# Patient Record
Sex: Female | Born: 1965 | Race: White | Hispanic: No | Marital: Married | Smoking: Former smoker
Health system: Southern US, Community
[De-identification: ages and names within clinical notes are randomized; demographics above are authoritative.]

## PROBLEM LIST (undated history)

## (undated) DIAGNOSIS — H8110 Benign paroxysmal vertigo, unspecified ear: Secondary | ICD-10-CM

## (undated) DIAGNOSIS — J45909 Unspecified asthma, uncomplicated: Secondary | ICD-10-CM

## (undated) DIAGNOSIS — I10 Essential (primary) hypertension: Secondary | ICD-10-CM

## (undated) DIAGNOSIS — R200 Anesthesia of skin: Secondary | ICD-10-CM

## (undated) DIAGNOSIS — R5383 Other fatigue: Secondary | ICD-10-CM

## (undated) DIAGNOSIS — R51 Headache: Secondary | ICD-10-CM

## (undated) DIAGNOSIS — F419 Anxiety disorder, unspecified: Secondary | ICD-10-CM

## (undated) DIAGNOSIS — J189 Pneumonia, unspecified organism: Secondary | ICD-10-CM

## (undated) DIAGNOSIS — B3731 Acute candidiasis of vulva and vagina: Secondary | ICD-10-CM

## (undated) DIAGNOSIS — B373 Candidiasis of vulva and vagina: Secondary | ICD-10-CM

## (undated) DIAGNOSIS — R42 Dizziness and giddiness: Secondary | ICD-10-CM

## (undated) DIAGNOSIS — H6500 Acute serous otitis media, unspecified ear: Secondary | ICD-10-CM

## (undated) DIAGNOSIS — G4730 Sleep apnea, unspecified: Secondary | ICD-10-CM

## (undated) DIAGNOSIS — J069 Acute upper respiratory infection, unspecified: Secondary | ICD-10-CM

## (undated) HISTORY — DX: Other fatigue: R53.83

## (undated) HISTORY — DX: Benign paroxysmal vertigo, unspecified ear: H81.10

## (undated) HISTORY — DX: Acute serous otitis media, unspecified ear: H65.00

## (undated) HISTORY — DX: Anesthesia of skin: R20.0

## (undated) HISTORY — DX: Acute upper respiratory infection, unspecified: J06.9

## (undated) HISTORY — DX: Sleep apnea, unspecified: G47.30

## (undated) HISTORY — DX: Candidiasis of vulva and vagina: B37.3

## (undated) HISTORY — DX: Dizziness and giddiness: R42

## (undated) HISTORY — DX: Anxiety disorder, unspecified: F41.9

## (undated) HISTORY — DX: Acute candidiasis of vulva and vagina: B37.31

## (undated) HISTORY — DX: Headache: R51

## (undated) HISTORY — DX: Essential (primary) hypertension: I10

## (undated) HISTORY — DX: Pneumonia, unspecified organism: J18.9

## (undated) HISTORY — DX: Unspecified asthma, uncomplicated: J45.909

## (undated) HISTORY — PX: TUBAL LIGATION: SHX77

---

## 1998-05-25 ENCOUNTER — Encounter: Payer: Self-pay | Admitting: Emergency Medicine

## 1998-05-25 ENCOUNTER — Emergency Department (HOSPITAL_COMMUNITY): Admission: EM | Admit: 1998-05-25 | Discharge: 1998-05-25 | Payer: Self-pay | Admitting: Emergency Medicine

## 1999-04-02 ENCOUNTER — Other Ambulatory Visit: Admission: RE | Admit: 1999-04-02 | Discharge: 1999-04-02 | Payer: Self-pay | Admitting: Gynecology

## 2005-03-20 ENCOUNTER — Other Ambulatory Visit: Admission: RE | Admit: 2005-03-20 | Discharge: 2005-03-20 | Payer: Self-pay | Admitting: Obstetrics and Gynecology

## 2008-01-07 ENCOUNTER — Encounter (INDEPENDENT_AMBULATORY_CARE_PROVIDER_SITE_OTHER): Payer: Self-pay | Admitting: Obstetrics and Gynecology

## 2008-01-07 ENCOUNTER — Ambulatory Visit (HOSPITAL_COMMUNITY): Admission: RE | Admit: 2008-01-07 | Discharge: 2008-01-07 | Payer: Self-pay | Admitting: Obstetrics and Gynecology

## 2010-10-08 NOTE — Op Note (Signed)
Julie Huffman, Julie Huffman               ACCOUNT NO.:  [id number]   MEDICAL RECORD NO.:  [id number]          PATIENT TYPE:  AMB   LOCATION:  SDC                           FACILITY:  [facility]   PHYSICIAN:  Michelle L. Grewal, M.D.DATE OF BIRTH:  [birth year]   DATE OF PROCEDURE:  01/07/2008  DATE OF DISCHARGE:  01/07/2008                               OPERATIVE REPORT   PREOPERATIVE DIAGNOSES:  1. Desires permanent sterilization.  2. Menorrhagia.   POSTOPERATIVE DIAGNOSES:  1. Desires permanent sterilization.  2. Menorrhagia.  3. Fibroids.   PROCEDURES:  1. Diagnostic laparoscopy with bilateral tubal ligation.  2. Dilation and curettage.  3. Hysteroscopy.  4. ThermaChoice endometrial ablation.   SURGEON:  Michelle L. Vincente Poli, MD.   ANESTHESIA:  General.   ESTIMATED BLOOD LOSS:  Minimal.   PROCEDURE:  The patient was taken to the operating room after informed  consent was obtained.  She was then prepped and draped in the usual  sterile fashion.  An in-and-out catheter used was used to empty the  bladder.  A small infraumbilical incision was made.  The Veress needle  was inserted and pneumoperitoneum was performed.  The 11-mm trocar was  then inserted.  The laparoscope was introduced through the trocar  sheath.  The patient was placed in Trendelenburg position.  Exam of the  pelvis revealed that she had some fibroids in her uterus.  Ovaries  appeared normal.  Using the Kleppinger, I identified the right fallopian  tube, followed it to its fimbriated end, grasped the midportion, and  performed a tubal ligation 3 times with triple-burn technique.  This was  done on the left side in identical fashion.  At the end of this, no  bleeding was noted whatsoever.  The laparoscope was removed, the  pneumoperitoneum was released, the trocar was removed, and the skin was  reapproximated using a 3-0 Vicryl and Dermabond skin adhesive.  At this  point, we went down vaginally.  A speculum was placed in  the vagina.  The cervix was grasped with a tenaculum.  Local was placed at 5 and 7  o'clock.  The cervical internal os was gently dilated using Pratt  dilators.  The diagnostic hysteroscope was inserted into the uterine  cavity with excellent visualization.  The cavity appeared normal.  The  hysteroscope was removed.  A sharp curette was inserted and a thorough  uterine curettage was performed.  The tissue sent to pathology.  The  ThermaChoice III balloon was  inserted and a ThermaChoice endometrial ablation was performed according  to the manufacturer's specification.  The intact balloon was removed  after the 8-minute cycle.  All instruments were removed from the vagina.  All sponge, lap and instrument counts were correct x2.  The patient went  to recovery room in stable condition.      Michelle L. Vincente Poli, M.D.  Electronically Signed     MLG/MEDQ  D:  01/10/2008  T:  01/10/2008  Job:  [id number]

## 2011-02-21 LAB — CBC
HCT: 44.5
Hemoglobin: 15
MCHC: 33.6
MCV: 89.1
Platelets: 383
RBC: 5
RDW: 12.5
WBC: 10.5

## 2011-02-21 LAB — BASIC METABOLIC PANEL
BUN: 7
CO2: 27
Calcium: 9.1
Chloride: 104
Creatinine, Ser: 0.79
GFR calc Af Amer: >60
GFR calc non Af Amer: >60
Glucose, Bld: 120 — ABNORMAL HIGH
Potassium: 4.3
Sodium: 138

## 2011-02-21 LAB — HCG, SERUM, QUALITATIVE: Preg, Serum: NEGATIVE

## 2012-02-04 ENCOUNTER — Encounter: Payer: Self-pay | Admitting: Cardiology

## 2012-02-05 ENCOUNTER — Institutional Professional Consult (permissible substitution): Payer: Self-pay | Admitting: Pulmonary Disease

## 2012-02-05 ENCOUNTER — Institutional Professional Consult (permissible substitution): Payer: Self-pay | Admitting: Cardiology

## 2012-02-27 ENCOUNTER — Ambulatory Visit (INDEPENDENT_AMBULATORY_CARE_PROVIDER_SITE_OTHER): Payer: BC Managed Care – PPO | Admitting: Cardiology

## 2012-02-27 ENCOUNTER — Encounter: Payer: Self-pay | Admitting: *Deleted

## 2012-02-27 ENCOUNTER — Encounter: Payer: Self-pay | Admitting: Cardiology

## 2012-02-27 VITALS — BP 128/80 | HR 78 | Ht 61.0 in | Wt 202.1 lb

## 2012-02-27 DIAGNOSIS — Z8249 Family history of ischemic heart disease and other diseases of the circulatory system: Secondary | ICD-10-CM | POA: Insufficient documentation

## 2012-02-27 DIAGNOSIS — F17200 Nicotine dependence, unspecified, uncomplicated: Secondary | ICD-10-CM

## 2012-02-27 DIAGNOSIS — R0789 Other chest pain: Secondary | ICD-10-CM | POA: Insufficient documentation

## 2012-02-27 DIAGNOSIS — E669 Obesity, unspecified: Secondary | ICD-10-CM | POA: Insufficient documentation

## 2012-02-27 DIAGNOSIS — Z720 Tobacco use: Secondary | ICD-10-CM | POA: Insufficient documentation

## 2012-02-27 LAB — LIPID PANEL
Cholesterol: 194 mg/dL (ref 0–200)
HDL: 40.3 mg/dL (ref >39.00)
LDL Cholesterol: 125 mg/dL — ABNORMAL HIGH (ref 0–99)
Total CHOL/HDL Ratio: 5
Triglycerides: 143 mg/dL (ref 0.0–149.0)
VLDL: 28.6 mg/dL (ref 0.0–40.0)

## 2012-02-27 NOTE — Patient Instructions (Signed)
We will schedule you for a stress Echo   I recommend you have a fasting lipid panel done  Follow up with pulmonary as noted.

## 2012-02-27 NOTE — Progress Notes (Signed)
Nadara Mode Date of Birth: October 30, 1965 Medical Record #[id number]  History of Present Illness: Mrs. Julie Huffman is seen at the request of [facility] for evaluation of chest tightness. She is a pleasant 46 year old white female who has a history of tobacco abuse, obesity, and family history of coronary disease. Her cholesterol status is unknown. She has been on medication previously for hypertension but no longer requires. She has no history of diabetes. Patient presents with a number of complaints. She does complain of 6 month history of intermittent chest tightness. This may occur at any time. She does have a history of asthma. She has also tried Zantac for relief and think she got a little bit of relief. She admits that she has been under a lot of stress. She is very worried about having blockage in her arteries.  Current Outpatient Prescriptions on File Prior to Visit  Medication Sig Dispense Refill  . escitalopram (LEXAPRO) 10 MG tablet Take 10 mg by mouth daily.       . Fluticasone-Salmeterol (ADVAIR) 250-50 MCG/DOSE AEPB Inhale 1 puff into the lungs every 12 (twelve) hours.      Marland Kitchen albuterol (PROVENTIL HFA;VENTOLIN HFA) 108 (90 BASE) MCG/ACT inhaler Inhale 2 puffs into the lungs every 6 (six) hours as needed.      Marland Kitchen MIMVEY 1-0.5 MG per tablet         No Known Allergies  Past Medical History  Diagnosis Date  . Dizziness   . Headache   . Fatigue   . Sleep apnea   . Left arm numbness   . Benign paroxysmal positional vertigo   . Acute serous otitis media   . Acute upper respiratory infections of unspecified site   . Anxiety disorder   . Asthma   . Hypertension   . Pneumonia, organism unspecified   . Candidiasis of vulva and vagina     Past Surgical History  Procedure Date  . Tubal ligation     History  Smoking status  . Current Every Day Smoker  . Types: Cigarettes  Smokeless tobacco  . Never Used    History  Alcohol Use: Not on file    History reviewed.  No pertinent family history.  Review of Systems: The review of systems is positive for recent urine infection. This is associated with significant headache and vertigo. She was treated with antibiotics with improvement. She also complains of not sleeping well. She feels tired during the day. She wakes up feeling tired. She does have a history of snoring and restless legs. He does have a history of tobacco use. She has tried with Chantix in the past to quit. She is concerned about weight gain if she tries to quit. Her plan is to lose down to 130 pounds and then to try quitting. All other systems were reviewed and are negative.  Physical Exam: BP 128/80  Pulse 78  Ht 5\' 1"  (1.549 m)  Wt 202 lb 1.9 oz (91.681 kg)  BMI 38.19 kg/m2  SpO2 94% She is a pleasant, obese white female in no acute distress. Her HEENT exam is unremarkable. PERRLA, EOMI, sclera are clear. Oropharynx is clear. Neck is supple without JVD, adenopathy, thyromegaly, or bruits. Lungs are clear. Cardiac exam reveals a regular rate and rhythm without gallop, murmur, or click. Breasts are pendulous. Abdomen is soft and nontender. She has no masses or bruits. It is obese. Femoral and pedal pulses are 2+. She has no edema. Skin is warm and dry. She  is alert and oriented x3. Cranial nerves II through XII are intact.  LABORATORY DATA: Recent ECG shows normal sinus rhythm with a normal ECG. Chemistry panel and TSH were also normal recently.  Assessment / Plan: 1. Chest tightness. Symptoms are atypical for cardiac disease but she does have multiple risk factors including tobacco use, family history, and obesity. I recommended a stress echo to further stratify her cardiac risk. She already has an appointment scheduled with pulmonary for evaluation as well. Her symptoms may be related to asthma, GERD, or sleep apnea.  2. Obesity. Patient hasn't multiple symptoms consistent with sleep apnea. Further evaluation per pulmonary.  3. Lipid status  is unknown. I recommended a fasting lipid panel.  4. Tobacco abuse.

## 2012-03-02 ENCOUNTER — Institutional Professional Consult (permissible substitution): Payer: Self-pay | Admitting: Pulmonary Disease

## 2012-03-03 ENCOUNTER — Encounter: Payer: Self-pay | Admitting: Cardiology

## 2012-03-12 ENCOUNTER — Telehealth: Payer: Self-pay | Admitting: Cardiology

## 2012-03-12 NOTE — Telephone Encounter (Signed)
Pt rtn your call

## 2012-03-12 NOTE — Telephone Encounter (Signed)
Patient called no answer.Left message on personal voice mail letter mailed with lab results.Advised to call if any questions.

## 2012-03-12 NOTE — Telephone Encounter (Signed)
Patient called no answer.LMTC. 

## 2012-03-24 ENCOUNTER — Encounter: Payer: Self-pay | Admitting: Cardiology

## 2012-03-24 ENCOUNTER — Ambulatory Visit (HOSPITAL_COMMUNITY): Payer: BC Managed Care – PPO | Attending: Cardiology

## 2012-03-24 ENCOUNTER — Other Ambulatory Visit: Payer: Self-pay

## 2012-03-24 DIAGNOSIS — Z8249 Family history of ischemic heart disease and other diseases of the circulatory system: Secondary | ICD-10-CM

## 2012-03-24 DIAGNOSIS — R0989 Other specified symptoms and signs involving the circulatory and respiratory systems: Secondary | ICD-10-CM

## 2012-03-24 DIAGNOSIS — Z720 Tobacco use: Secondary | ICD-10-CM

## 2012-03-24 DIAGNOSIS — R0789 Other chest pain: Secondary | ICD-10-CM

## 2012-03-24 DIAGNOSIS — F17200 Nicotine dependence, unspecified, uncomplicated: Secondary | ICD-10-CM | POA: Insufficient documentation

## 2012-03-24 DIAGNOSIS — J45909 Unspecified asthma, uncomplicated: Secondary | ICD-10-CM | POA: Insufficient documentation

## 2012-03-24 DIAGNOSIS — E669 Obesity, unspecified: Secondary | ICD-10-CM

## 2012-03-24 DIAGNOSIS — I10 Essential (primary) hypertension: Secondary | ICD-10-CM | POA: Insufficient documentation

## 2012-03-24 DIAGNOSIS — R072 Precordial pain: Secondary | ICD-10-CM | POA: Insufficient documentation

## 2012-03-24 NOTE — Progress Notes (Signed)
Echocardiogram performed.  

## 2012-04-07 ENCOUNTER — Encounter: Payer: Self-pay | Admitting: Pulmonary Disease

## 2012-04-07 ENCOUNTER — Ambulatory Visit (INDEPENDENT_AMBULATORY_CARE_PROVIDER_SITE_OTHER): Payer: BC Managed Care – PPO | Admitting: Pulmonary Disease

## 2012-04-07 VITALS — BP 120/90 | HR 100 | Temp 98.5°F | Ht 62.0 in | Wt 202.8 lb

## 2012-04-07 DIAGNOSIS — G4733 Obstructive sleep apnea (adult) (pediatric): Secondary | ICD-10-CM

## 2012-04-07 HISTORY — DX: Obstructive sleep apnea (adult) (pediatric): G47.33

## 2012-04-07 NOTE — Assessment & Plan Note (Signed)
The patient's history is very suggestive of severe sleep apnea.  She is obese, has abnormal upper airway anatomy, has nonrestorative sleep, and significant sleepiness during the day.  I have had a long discussion with her about sleep apnea, including its impact to her quality of life and cardiovascular health.  I think she needs to have a sleep study for diagnosis, and the patient is agreeable.

## 2012-04-07 NOTE — Patient Instructions (Addendum)
Will schedule for a sleep study, and arrange followup once results are available.   

## 2012-04-07 NOTE — Progress Notes (Signed)
  Subjective:    Patient ID: Julie Huffman, female    DOB: 1966-02-10, 46 y.o.   MRN: [id number]  HPI The patient is a 46 year old female who been asked to see for possible obstructive sleep apnea.  She has a history of loud snoring, as well as an abnormal breathing pattern during sleep.  She has awakenings at night, and has not rested in the mornings upon arising.  She has significant sleepiness during the day with any period of inactivity, and will fall asleep very easily in church.  She is unable to stay awake for television or movies in the evening, and has some sleepiness with driving.  The patient states that her weight is neutral over the last few years, and her Epworth score today is abnormal at 21  Sleep Questionnaire: What time do you typically go to bed?( Between what hours) 10 - 11 pm How long does it take you to fall asleep? varies How many times during the night do you wake up? What time do you get out of bed to start your day? Do you drive or operate heavy machinery in your occupation? No How much has your weight changed (up or down) over the past two years? (In pounds) 20 lb (9.072 kg) Have you ever had a sleep study before? No Do you currently use CPAP? No Do you wear oxygen at any time? No    Review of Systems  Constitutional: Negative for fever and unexpected weight change.  HENT: Positive for congestion, rhinorrhea and sinus pressure. Negative for ear pain, nosebleeds, sore throat, sneezing, trouble swallowing, dental problem and postnasal drip.   Eyes: Negative for redness and itching.  Respiratory: Positive for cough and shortness of breath. Negative for chest tightness and wheezing.   Cardiovascular: Negative for palpitations and leg swelling.  Gastrointestinal: Negative for nausea and vomiting.  Genitourinary: Negative for dysuria.  Musculoskeletal: Negative for joint swelling.  Skin: Negative for rash.  Neurological: Positive for headaches.  Hematological: Does not  bruise/bleed easily.  Psychiatric/Behavioral: Positive for dysphoric mood. The patient is nervous/anxious.        Objective:   Physical Exam Constitutional:  Obese female, no acute distress  HENT:  Nares patent without discharge, but septal deviation to the left.  Oropharynx without exudate, palate and uvula are thick and elongated, +side wall narrowing.  Eyes:  Perrla, eomi, no scleral icterus  Neck:  No JVD, no TMG  Cardiovascular:  Normal rate, regular rhythm, no rubs or gallops.  2/6 sem        Intact distal pulses  Pulmonary :  Normal breath sounds, no stridor or respiratory distress   No rales, rhonchi, a few wheezes noted  Abdominal:  Soft, nondistended, bowel sounds present.  No tenderness noted.   Musculoskeletal: mild lower extremity edema noted.  Lymph Nodes:  No cervical lymphadenopathy noted  Skin:  No cyanosis noted  Neurologic:  Alert, appropriate, moves all 4 extremities without obvious deficit.         Assessment & Plan:

## 2012-04-15 ENCOUNTER — Other Ambulatory Visit: Payer: Self-pay | Admitting: Obstetrics and Gynecology

## 2012-05-05 ENCOUNTER — Encounter (HOSPITAL_BASED_OUTPATIENT_CLINIC_OR_DEPARTMENT_OTHER): Payer: BC Managed Care – PPO

## 2012-05-11 ENCOUNTER — Ambulatory Visit (HOSPITAL_BASED_OUTPATIENT_CLINIC_OR_DEPARTMENT_OTHER): Payer: BC Managed Care – PPO | Attending: Pulmonary Disease | Admitting: Radiology

## 2012-05-11 VITALS — Ht 62.0 in | Wt 200.0 lb

## 2012-05-11 DIAGNOSIS — G4733 Obstructive sleep apnea (adult) (pediatric): Secondary | ICD-10-CM | POA: Insufficient documentation

## 2012-06-08 DIAGNOSIS — G4730 Sleep apnea, unspecified: Secondary | ICD-10-CM

## 2012-06-08 DIAGNOSIS — G4710 Hypersomnia, unspecified: Secondary | ICD-10-CM

## 2012-06-09 ENCOUNTER — Telehealth: Payer: Self-pay | Admitting: Pulmonary Disease

## 2012-06-09 NOTE — Telephone Encounter (Addendum)
LMTCB x 1 at home #.  Needs ASAP ov. Can use a 4:30 held slot, except on Mondays or 1st available. Voicemail has not been set up on mobile #. Work # msg says this # cannot be completed as dialed, check the # and dial again.

## 2012-06-09 NOTE — Procedures (Signed)
NAME:  Julie Huffman, Julie Huffman               ACCOUNT NO.:  [id number]  MEDICAL RECORD NO.:  [id number]          PATIENT TYPE:  OUT  LOCATION:  SLEEP CENTER                 FACILITY:  [facility]  PHYSICIAN:  Barbaraann Share, MD,FCCPDATE OF BIRTH:  10/24/1965  DATE OF STUDY:  05/11/2012                           NOCTURNAL POLYSOMNOGRAM  REFERRING PHYSICIAN:  Barbaraann Share, MD,FCCP  INDICATION FOR STUDY:  Hypersomnia with sleep apnea.  EPWORTH SLEEPINESS SCORE:  19.  MEDICATIONS:  SLEEP ARCHITECTURE:  The patient had a total sleep time of 383 minutes with no slow-wave sleep and only 11 minutes of REM.  Sleep onset latency was normal at 1 minute and REM onset was very prolonged at 326 minutes. Sleep efficiency was excellent at 99%.  RESPIRATORY DATA:  The patient was found to have 176 obstructive apneas and 113 obstructive hypopneas, giving her an apnea-hypopnea index of 45 events per hour.  The events occur primarily in the supine position, and there was loud snoring noted throughout.  OXYGEN DATA:  There was O2 desaturation as low as 61% with the patient's obstructive events.  CARDIAC DATA:  No clinically significant arrhythmias were noted.  MOVEMENT-PARASOMNIA:  The patient has no significant leg jerks or other abnormal behavior seen.  IMPRESSIONS-RECOMMENDATIONS:  Severe obstructive sleep apnea/hypopnea syndrome, with an apnea-hypopnea index of 45 events per hour and oxygen desaturation as low as 61%.  Treatment for this degree of sleep apnea should focus primarily on continuous positive airway pressure as well as a trial of weight loss.     Barbaraann Share, MD,FCCP Diplomate, American Board of Sleep Medicine    KMC/MEDQ  D:  06/08/2012 18:47:06  T:  06/09/2012 01:59:42  Job:  [id number]

## 2012-06-09 NOTE — Telephone Encounter (Signed)
Pt returned call. Per Lawson Fiscal I have scheduled f/u for sleep study results for Tues. 06-15-12 @ 4:30. Hazel Sams

## 2012-06-09 NOTE — Telephone Encounter (Signed)
Noted and sleep study placed in the file draw up front until appt date arrives.

## 2012-06-15 ENCOUNTER — Encounter: Payer: Self-pay | Admitting: Pulmonary Disease

## 2012-06-15 ENCOUNTER — Ambulatory Visit (INDEPENDENT_AMBULATORY_CARE_PROVIDER_SITE_OTHER): Payer: BC Managed Care – PPO | Admitting: Pulmonary Disease

## 2012-06-15 VITALS — BP 120/80 | HR 73 | Temp 97.3°F | Ht 62.0 in | Wt 210.0 lb

## 2012-06-15 DIAGNOSIS — G4733 Obstructive sleep apnea (adult) (pediatric): Secondary | ICD-10-CM

## 2012-06-15 NOTE — Assessment & Plan Note (Signed)
The patient has severe obstructive sleep apnea by her recent sleep study, and I have recommended a trial of CPAP while she is working on weight loss.  The patient is agreeable to this approach. I will set the patient up on cpap at a moderate pressure level to allow for desensitization, and will troubleshoot the device over the next 4-6weeks if needed.  The pt is to call me if having issues with tolerance.  Will then optimize the pressure once patient is able to wear cpap on a consistent basis.

## 2012-06-15 NOTE — Progress Notes (Signed)
  Subjective:    Patient ID: Julie Huffman, female    DOB: 02-06-1966, 47 y.o.   MRN: [id number]  HPI The patient comes in today for followup after her recent sleep study.  She was found to have severe obstructive sleep apnea, with an AHI of 45 events per hour and oxygen desaturation as low as 61%.  I have reviewed this study with her in detail, and answered all of her questions.   Review of Systems  Constitutional: Negative for fever and unexpected weight change.  HENT: Positive for ear pain, congestion, sneezing and sinus pressure. Negative for nosebleeds, sore throat, rhinorrhea, trouble swallowing, dental problem and postnasal drip.   Eyes: Negative for redness and itching.  Respiratory: Positive for cough. Negative for chest tightness, shortness of breath and wheezing.   Cardiovascular: Negative for palpitations and leg swelling.  Gastrointestinal: Negative for nausea and vomiting.  Genitourinary: Negative for dysuria.  Musculoskeletal: Negative for joint swelling.  Skin: Negative for rash.  Neurological: Positive for headaches.  Hematological: Does not bruise/bleed easily.  Psychiatric/Behavioral: Negative for dysphoric mood. The patient is not nervous/anxious.        Objective:   Physical Exam Morbidly obese female in no acute distress Nose without purulence or discharge noted Neck without lymphadenopathy or thyromegaly Lower extremities with edema noted, no cyanosis Alert and oriented, moves all 4 extremities.       Assessment & Plan:

## 2012-06-15 NOTE — Patient Instructions (Addendum)
Will set up on cpap.  Please call if having tolerance issues. Work on weight loss followup with me in 6 weeks.

## 2012-07-28 ENCOUNTER — Ambulatory Visit: Payer: BC Managed Care – PPO | Admitting: Pulmonary Disease

## 2012-10-12 ENCOUNTER — Telehealth: Payer: Self-pay | Admitting: Pulmonary Disease

## 2012-10-12 NOTE — Telephone Encounter (Signed)
LMTCB

## 2012-10-13 NOTE — Telephone Encounter (Signed)
LMOM TCB x2

## 2012-10-14 NOTE — Telephone Encounter (Signed)
LMTCB on cell number and home number. Carron Curie, CMA

## 2012-10-15 NOTE — Telephone Encounter (Signed)
Called spoke with patient who stated that she just wanted to know the name of her DME company because she was having some issues with her CPAP machine and wanted to speak with someone troubleshooting.  Pt stated that she found the name on the internet ([employer]) and is in the process of contacting someone w/ [employer].  Offered to call [employer] for her, but pt declined.  She will call back if unable to reach [employer].  Nothing further needed at this time; will sign off.

## 2013-05-11 ENCOUNTER — Other Ambulatory Visit: Payer: Self-pay | Admitting: Obstetrics and Gynecology

## 2014-05-12 ENCOUNTER — Other Ambulatory Visit: Payer: Self-pay | Admitting: Obstetrics and Gynecology

## 2014-05-15 LAB — CYTOLOGY - PAP

## 2015-05-24 ENCOUNTER — Other Ambulatory Visit: Payer: Self-pay | Admitting: Family Medicine

## 2015-05-24 ENCOUNTER — Ambulatory Visit
Admission: RE | Admit: 2015-05-24 | Discharge: 2015-05-24 | Disposition: A | Discharge status: Home / Self Care | Payer: BC Managed Care – PPO | Source: Ambulatory Visit | Attending: Family Medicine | Admitting: Family Medicine

## 2015-05-24 ENCOUNTER — Other Ambulatory Visit: Payer: Self-pay

## 2015-05-24 DIAGNOSIS — J0101 Acute recurrent maxillary sinusitis: Secondary | ICD-10-CM

## 2016-02-05 ENCOUNTER — Other Ambulatory Visit (HOSPITAL_COMMUNITY): Payer: Self-pay | Admitting: Family Medicine

## 2016-02-05 ENCOUNTER — Ambulatory Visit (HOSPITAL_COMMUNITY)
Admission: RE | Admit: 2016-02-05 | Discharge: 2016-02-05 | Disposition: A | Discharge status: Home / Self Care | Payer: BC Managed Care – PPO | Source: Ambulatory Visit | Attending: Family Medicine | Admitting: Family Medicine

## 2016-02-05 DIAGNOSIS — M79605 Pain in left leg: Secondary | ICD-10-CM | POA: Insufficient documentation

## 2016-02-05 DIAGNOSIS — M7989 Other specified soft tissue disorders: Secondary | ICD-10-CM | POA: Diagnosis not present

## 2016-02-05 NOTE — Progress Notes (Signed)
Preliminary results by tech - Venous Duplex Left Lower Ext. Completed. Negative for deep and superficial vein thrombosis, and Baker's Cyst. Marilynne Halstedita Kamelia Lampkins, BS, RDMS, RVT

## 2020-09-20 ENCOUNTER — Emergency Department (HOSPITAL_COMMUNITY): Payer: BC Managed Care – PPO

## 2020-09-20 ENCOUNTER — Encounter (HOSPITAL_COMMUNITY)
Admission: EM | Disposition: A | Discharge status: Home / Self Care | Payer: Self-pay | Source: Home / Self Care | Attending: Internal Medicine

## 2020-09-20 ENCOUNTER — Encounter (HOSPITAL_COMMUNITY): Payer: Self-pay

## 2020-09-20 ENCOUNTER — Inpatient Hospital Stay (HOSPITAL_COMMUNITY)
Admission: EM | Admit: 2020-09-20 | Discharge: 2020-09-22 | DRG: 906 | Disposition: A | Discharge status: Home / Self Care | Payer: BC Managed Care – PPO | Attending: Internal Medicine | Admitting: Internal Medicine

## 2020-09-20 ENCOUNTER — Other Ambulatory Visit: Payer: Self-pay

## 2020-09-20 DIAGNOSIS — S68123A Partial traumatic metacarpophalangeal amputation of left middle finger, initial encounter: Principal | ICD-10-CM | POA: Diagnosis present

## 2020-09-20 DIAGNOSIS — R0602 Shortness of breath: Secondary | ICD-10-CM

## 2020-09-20 DIAGNOSIS — Z23 Encounter for immunization: Secondary | ICD-10-CM

## 2020-09-20 DIAGNOSIS — S62631B Displaced fracture of distal phalanx of left index finger, initial encounter for open fracture: Secondary | ICD-10-CM | POA: Diagnosis not present

## 2020-09-20 DIAGNOSIS — F411 Generalized anxiety disorder: Secondary | ICD-10-CM | POA: Diagnosis present

## 2020-09-20 DIAGNOSIS — Z79899 Other long term (current) drug therapy: Secondary | ICD-10-CM

## 2020-09-20 DIAGNOSIS — J440 Chronic obstructive pulmonary disease with acute lower respiratory infection: Secondary | ICD-10-CM | POA: Diagnosis present

## 2020-09-20 DIAGNOSIS — F17210 Nicotine dependence, cigarettes, uncomplicated: Secondary | ICD-10-CM | POA: Diagnosis present

## 2020-09-20 DIAGNOSIS — S68125A Partial traumatic metacarpophalangeal amputation of left ring finger, initial encounter: Secondary | ICD-10-CM | POA: Diagnosis present

## 2020-09-20 DIAGNOSIS — J9601 Acute respiratory failure with hypoxia: Secondary | ICD-10-CM | POA: Diagnosis present

## 2020-09-20 DIAGNOSIS — E876 Hypokalemia: Secondary | ICD-10-CM | POA: Diagnosis present

## 2020-09-20 DIAGNOSIS — F419 Anxiety disorder, unspecified: Secondary | ICD-10-CM | POA: Diagnosis present

## 2020-09-20 DIAGNOSIS — Z20822 Contact with and (suspected) exposure to covid-19: Secondary | ICD-10-CM | POA: Diagnosis present

## 2020-09-20 DIAGNOSIS — I10 Essential (primary) hypertension: Secondary | ICD-10-CM | POA: Diagnosis present

## 2020-09-20 DIAGNOSIS — J189 Pneumonia, unspecified organism: Secondary | ICD-10-CM | POA: Diagnosis present

## 2020-09-20 DIAGNOSIS — J9602 Acute respiratory failure with hypercapnia: Secondary | ICD-10-CM | POA: Diagnosis not present

## 2020-09-20 DIAGNOSIS — G9341 Metabolic encephalopathy: Secondary | ICD-10-CM | POA: Diagnosis present

## 2020-09-20 DIAGNOSIS — K219 Gastro-esophageal reflux disease without esophagitis: Secondary | ICD-10-CM | POA: Diagnosis present

## 2020-09-20 DIAGNOSIS — G4733 Obstructive sleep apnea (adult) (pediatric): Secondary | ICD-10-CM | POA: Diagnosis present

## 2020-09-20 DIAGNOSIS — J45901 Unspecified asthma with (acute) exacerbation: Secondary | ICD-10-CM | POA: Diagnosis present

## 2020-09-20 DIAGNOSIS — Z8616 Personal history of COVID-19: Secondary | ICD-10-CM

## 2020-09-20 DIAGNOSIS — Z9851 Tubal ligation status: Secondary | ICD-10-CM

## 2020-09-20 DIAGNOSIS — J159 Unspecified bacterial pneumonia: Secondary | ICD-10-CM | POA: Diagnosis present

## 2020-09-20 DIAGNOSIS — W28XXXA Contact with powered lawn mower, initial encounter: Secondary | ICD-10-CM

## 2020-09-20 HISTORY — PX: I & D EXTREMITY: SHX5045

## 2020-09-20 HISTORY — PX: INCISION AND DRAINAGE OF WOUND: SHX1803

## 2020-09-20 LAB — CBC
HCT: 42.7 % (ref 36.0–46.0)
Hemoglobin: 13.7 g/dL (ref 12.0–15.0)
MCH: 29.8 pg (ref 26.0–34.0)
MCHC: 32.1 g/dL (ref 30.0–36.0)
MCV: 92.8 fL (ref 80.0–100.0)
Platelets: 255 10*3/uL (ref 150–400)
RBC: 4.6 MIL/uL (ref 3.87–5.11)
RDW: 12.7 % (ref 11.5–15.5)
WBC: 8.1 10*3/uL (ref 4.0–10.5)
nRBC: 0 % (ref 0.0–0.2)

## 2020-09-20 LAB — BASIC METABOLIC PANEL
Anion gap: 9 (ref 5–15)
BUN: 11 mg/dL (ref 6–20)
CO2: 30 mmol/L (ref 22–32)
Calcium: 8.9 mg/dL (ref 8.9–10.3)
Chloride: 100 mmol/L (ref 98–111)
Creatinine, Ser: 0.76 mg/dL (ref 0.44–1.00)
GFR, Estimated: >60 mL/min (ref >60)
Glucose, Bld: 122 mg/dL — ABNORMAL HIGH (ref 70–99)
Potassium: 3 mmol/L — ABNORMAL LOW (ref 3.5–5.1)
Sodium: 139 mmol/L (ref 135–145)

## 2020-09-20 LAB — RESP PANEL BY RT-PCR (FLU A&B, COVID) ARPGX2
Influenza A by PCR: NEGATIVE
Influenza B by PCR: NEGATIVE
SARS Coronavirus 2 by RT PCR: NEGATIVE

## 2020-09-20 LAB — POC SARS CORONAVIRUS 2 AG -  ED: SARSCOV2ONAVIRUS 2 AG: NEGATIVE

## 2020-09-20 SURGERY — IRRIGATION AND DEBRIDEMENT EXTREMITY
Anesthesia: General | Site: Finger | Laterality: Left

## 2020-09-20 MED ORDER — HYDROMORPHONE HCL 1 MG/ML IJ SOLN
0.5000 mg | Freq: Once | INTRAMUSCULAR | Status: AC
  Administered 2020-09-20: 0.5 mg via INTRAVENOUS
  Filled 2020-09-20: qty 1

## 2020-09-20 MED ORDER — MIDAZOLAM HCL 2 MG/2ML IJ SOLN
INTRAMUSCULAR | Status: AC
  Filled 2020-09-20: qty 2

## 2020-09-20 MED ORDER — BUPIVACAINE HCL (PF) 0.25 % IJ SOLN
INTRAMUSCULAR | Status: AC
  Filled 2020-09-20: qty 30

## 2020-09-20 MED ORDER — TETANUS-DIPHTH-ACELL PERTUSSIS 5-2.5-18.5 LF-MCG/0.5 IM SUSY
0.5000 mL | PREFILLED_SYRINGE | Freq: Once | INTRAMUSCULAR | Status: AC
Start: 2020-09-20 — End: 2020-09-20
  Administered 2020-09-20: 0.5 mL via INTRAMUSCULAR
  Filled 2020-09-20: qty 0.5

## 2020-09-20 MED ORDER — PROPOFOL 10 MG/ML IV BOLUS
INTRAVENOUS | Status: AC
Start: 2020-09-20 — End: ?
  Filled 2020-09-20: qty 40

## 2020-09-20 MED ORDER — FENTANYL CITRATE (PF) 250 MCG/5ML IJ SOLN
INTRAMUSCULAR | Status: AC
  Filled 2020-09-20: qty 5

## 2020-09-20 SURGICAL SUPPLY — 62 items
ADAPTER CATH SYR TO TUBING 38M (ADAPTER) IMPLANT
BNDG COHESIVE 2X5 TAN STRL LF (GAUZE/BANDAGES/DRESSINGS) IMPLANT
BNDG ELASTIC 3X5.8 VLCR STR LF (GAUZE/BANDAGES/DRESSINGS) ×2 IMPLANT
BNDG ELASTIC 4X5.8 VLCR STR LF (GAUZE/BANDAGES/DRESSINGS) ×2 IMPLANT
BNDG ESMARK 4X9 LF (GAUZE/BANDAGES/DRESSINGS) ×2 IMPLANT
BNDG GAUZE ELAST 4 BULKY (GAUZE/BANDAGES/DRESSINGS) ×2 IMPLANT
CANNULA VESSEL 3MM 2 BLNT TIP (CANNULA) IMPLANT
CORD BIPOLAR FORCEPS 12FT (ELECTRODE) IMPLANT
COVER SURGICAL LIGHT HANDLE (MISCELLANEOUS) ×2 IMPLANT
COVER WAND RF STERILE (DRAPES) ×2 IMPLANT
CUFF TOURN SGL QUICK 18X4 (TOURNIQUET CUFF) IMPLANT
CUFF TOURN SGL QUICK 24 (TOURNIQUET CUFF)
CUFF TRNQT CYL 24X4X16.5-23 (TOURNIQUET CUFF) IMPLANT
DECANTER SPIKE VIAL GLASS SM (MISCELLANEOUS) ×2 IMPLANT
DRAIN PENROSE 1/4X12 LTX STRL (WOUND CARE) IMPLANT
DRSG PAD ABDOMINAL 8X10 ST (GAUZE/BANDAGES/DRESSINGS) ×4 IMPLANT
DRSG XEROFORM 1X8 (GAUZE/BANDAGES/DRESSINGS) ×2 IMPLANT
GAUZE SPONGE 4X4 12PLY STRL (GAUZE/BANDAGES/DRESSINGS) ×2 IMPLANT
GAUZE XEROFORM 1X8 LF (GAUZE/BANDAGES/DRESSINGS) ×2 IMPLANT
GLOVE BIO SURGEON STRL SZ7.5 (GLOVE) ×2 IMPLANT
GLOVE BIOGEL PI IND STRL 8.5 (GLOVE) IMPLANT
GLOVE BIOGEL PI INDICATOR 8.5 (GLOVE)
GLOVE BIOGEL PI ORTHO PRO SZ8 (GLOVE)
GLOVE PI ORTHO PRO STRL SZ8 (GLOVE) IMPLANT
GLOVE SRG 8 PF TXTR STRL LF DI (GLOVE) ×1 IMPLANT
GLOVE SURG ORTHO 8.0 STRL STRW (GLOVE) IMPLANT
GLOVE SURG UNDER POLY LF SZ8 (GLOVE) ×2
GOWN STRL REUS W/ TWL LRG LVL3 (GOWN DISPOSABLE) ×1 IMPLANT
GOWN STRL REUS W/ TWL XL LVL3 (GOWN DISPOSABLE) ×1 IMPLANT
GOWN STRL REUS W/TWL LRG LVL3 (GOWN DISPOSABLE) ×2
GOWN STRL REUS W/TWL XL LVL3 (GOWN DISPOSABLE) ×2
KIT BASIN OR (CUSTOM PROCEDURE TRAY) ×2 IMPLANT
KIT TURNOVER KIT B (KITS) ×2 IMPLANT
LOOP VESSEL MAXI BLUE (MISCELLANEOUS) IMPLANT
MANIFOLD NEPTUNE II (INSTRUMENTS) IMPLANT
NEEDLE HYPO 25X1 1.5 SAFETY (NEEDLE) ×2 IMPLANT
NS IRRIG 1000ML POUR BTL (IV SOLUTION) ×2 IMPLANT
PACK ORTHO EXTREMITY (CUSTOM PROCEDURE TRAY) ×2 IMPLANT
PAD ARMBOARD 7.5X6 YLW CONV (MISCELLANEOUS) ×4 IMPLANT
PAD CAST 4YDX4 CTTN HI CHSV (CAST SUPPLIES) ×1 IMPLANT
PADDING CAST ABS 3INX4YD NS (CAST SUPPLIES) ×1
PADDING CAST ABS COTTON 3X4 (CAST SUPPLIES) ×1 IMPLANT
PADDING CAST COTTON 4X4 STRL (CAST SUPPLIES) ×2
SET CYSTO W/LG BORE CLAMP LF (SET/KITS/TRAYS/PACK) IMPLANT
SLING ARM FOAM STRAP LRG (SOFTGOODS) ×2 IMPLANT
SOL PREP POV-IOD 4OZ 10% (MISCELLANEOUS) ×4 IMPLANT
SPLINT PLASTER EXTRA FAST 3X15 (CAST SUPPLIES) ×1
SPLINT PLASTER GYPS XFAST 3X15 (CAST SUPPLIES) ×1 IMPLANT
SPONGE LAP 4X18 RFD (DISPOSABLE) ×2 IMPLANT
SUT CHROMIC 6 0 PS 4 (SUTURE) ×2 IMPLANT
SUT ETHILON 4 0 P 3 18 (SUTURE) IMPLANT
SUT ETHILON 4 0 PS 2 18 (SUTURE) IMPLANT
SUT MON AB 5-0 P3 18 (SUTURE) ×4 IMPLANT
SWAB COLLECTION DEVICE MRSA (MISCELLANEOUS) IMPLANT
SWAB CULTURE ESWAB REG 1ML (MISCELLANEOUS) IMPLANT
SYR 20ML LL LF (SYRINGE) IMPLANT
SYR CONTROL 10ML LL (SYRINGE) IMPLANT
TOWEL GREEN STERILE (TOWEL DISPOSABLE) ×2 IMPLANT
TUBE CONNECTING 12X1/4 (SUCTIONS) ×2 IMPLANT
TUBE FEEDING ENTERAL 5FR 16IN (TUBING) IMPLANT
UNDERPAD 30X36 HEAVY ABSORB (UNDERPADS AND DIAPERS) ×2 IMPLANT
YANKAUER SUCT BULB TIP NO VENT (SUCTIONS) ×2 IMPLANT

## 2020-09-20 NOTE — ED Notes (Signed)
Jewelry removed from pt as much as able to do so and given to husband at this time. Tech will take pt to short stay

## 2020-09-20 NOTE — ED Notes (Signed)
Radiology at bedside at this time.

## 2020-09-20 NOTE — ED Notes (Signed)
Provider sent message reference pain meds at this time

## 2020-09-20 NOTE — ED Provider Notes (Signed)
[facility] EMERGENCY DEPARTMENT Provider Note   CSN: [id number] Arrival date & time: 09/20/20  1953     History Chief Complaint  Patient presents with  . Hand Injury    Julie Huffman is a 55 y.o. female.  HPI Patient is a 55 year old female with a history of hypertension who is presenting with a chief complaint of left hand injury.  Patient states that she accidentally got her hands cut by the lawnmower while attempting to unclog it.  She denies fevers or chills, nausea vomit, syncope or shortness of breath.  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 Patient otherwise ambulatory tolerating p.o. intake at this time and no other traumatic injuries appreciated.  Wounds are hemostatic on arrival to the emergency department.  Past Medical History:  Diagnosis Date  . Acute serous otitis media   . Acute upper respiratory infections of unspecified site   . Anxiety disorder   . Asthma   . Benign paroxysmal positional vertigo   . Candidiasis of vulva and vagina   . Dizziness   . Fatigue   . Headache(784.0)   . Hypertension   . Left arm numbness   . Pneumonia, organism unspecified(486)   . Sleep apnea     Patient Active Problem List   Diagnosis Date Noted  . OSA (obstructive sleep apnea) 04/07/2012  . Atypical chest pain 02/27/2012  . Family history of coronary artery disease 02/27/2012  . Tobacco abuse 02/27/2012  . Obesity 02/27/2012    Past Surgical History:  Procedure Laterality Date  . TUBAL LIGATION       OB History   No obstetric history on file.     No family history on file.  Social History   Tobacco Use  . Smoking status: Current Every Day Smoker    Packs/day: 0.60    Years: 25.00    Pack years: 15.00    Types: Cigarettes  . Smokeless tobacco: Never Used  . Tobacco comment: pt has stopped for a couple of years   Substance Use Topics  . Alcohol use: No    Home Medications Prior to Admission  medications   Medication Sig Start Date End Date Taking? Authorizing Provider  albuterol (PROVENTIL HFA;VENTOLIN HFA) 108 (90 BASE) MCG/ACT inhaler Inhale 2 puffs into the lungs every 6 (six) hours as needed.    [provider]  ALPRAZolam Prudy Feeler) 1 MG tablet Take 1 mg by mouth 2 (two) times daily as needed.  02/16/12   [provider]  carisoprodol (SOMA) 250 MG tablet Take 250 mg by mouth daily.  02/16/12   [provider]  cefdinir (OMNICEF) 300 MG capsule  02/17/12   [provider]  Cholecalciferol (VITAMIN D-3) 1000 UNITS CAPS Take 1 Can by mouth as directed.    [provider]  cyanocobalamin 1000 MCG tablet Take 100 mcg by mouth daily.    [provider]  escitalopram (LEXAPRO) 10 MG tablet Take 10 mg by mouth daily.  02/14/12   [provider]  Fluticasone-Salmeterol (ADVAIR) 250-50 MCG/DOSE AEPB Inhale 1 puff into the lungs every 12 (twelve) hours.    [provider]  MIMVEY 1-0.5 MG per tablet  02/14/12   [provider]  Multiple Vitamins-Minerals (MULTIVITAMINS THER. W/MINERALS) TABS Take 1 tablet by mouth daily.    [provider]  NON FORMULARY Spectra vite  Takes 1 a day .    [provider]  Omega-3 Fatty Acids (FISH OIL  PO) Take 1 Can by mouth daily.    [provider]  oxyCODONE-acetaminophen (PERCOCET) 7.5-325 MG per tablet Take 1 tablet by mouth every 6 (six) hours as needed.  02/02/12   [provider]    Allergies    Patient has no known allergies.  Review of Systems   Review of Systems  Constitutional: Negative for chills and fever.  HENT: Negative for ear pain and sore throat.   Eyes: Negative for pain and visual disturbance.  Respiratory: Negative for cough and shortness of breath.   Cardiovascular: Negative for chest pain and palpitations.  Gastrointestinal: Negative for abdominal pain and vomiting.  Genitourinary: Negative for dysuria and hematuria.   Musculoskeletal: Negative for arthralgias and back pain.  Skin: Negative for color change and rash.  Neurological: Negative for seizures and syncope.  All other systems reviewed and are negative.   Physical Exam Updated Vital Signs SpO2 99%   Physical Exam Vitals and nursing note reviewed.  Constitutional:      General: She is not in acute distress.    Appearance: She is well-developed.  HENT:     Head: Normocephalic and atraumatic.  Eyes:     Conjunctiva/sclera: Conjunctivae normal.  Cardiovascular:     Rate and Rhythm: Normal rate and regular rhythm.     Heart sounds: No murmur heard.   Pulmonary:     Effort: Pulmonary effort is normal. No respiratory distress.     Breath sounds: Normal breath sounds.  Abdominal:     General: There is no distension.     Palpations: Abdomen is soft.     Tenderness: There is no abdominal tenderness. There is no right CVA tenderness or left CVA tenderness.  Musculoskeletal:        General: Tenderness, deformity and signs of injury present. No swelling. Normal range of motion.     Cervical back: Neck supple.     Comments: Left hand, traumatic amputations of the index, middle and ring fingers distal to the distal interphalangeal joint.  Notably the middle finger with significant laceration and avulsion of soft tissue.  Patient with intact hand motions at this time and no obvious deformities at this location other than soft tissues.   Skin:    General: Skin is warm and dry.  Neurological:     General: No focal deficit present.     Mental Status: She is alert and oriented to person, place, and time. Mental status is at baseline.     Cranial Nerves: No cranial nerve deficit.     ED Results / Procedures / Treatments   Labs (all labs ordered are listed, but only abnormal results are displayed) Labs Reviewed - No data to display  EKG None  Radiology No results found.  Procedures Procedures   Medications Ordered in ED Medications -  No data to display  ED Course  I have reviewed the triage vital signs and the nursing notes.  Pertinent labs & imaging results that were available during my care of the patient were reviewed by me and considered in my medical decision making (see chart for details).    MDM Rules/Calculators/A&P                          Patient is a 55 year old female with minimal medical history presents with left hand injury.  Extensive soft tissue injury appreciated on exam.  Vital signs stable no signs of exsanguination at this time.  We will proceed  with x-ray and screening labs and communication with hand surgery given the nature of these injuries.  Hand surgery already on scene and planning for operative intervention.  Patient treated with Tdap, antibiotics and x-rays without obvious fractures other than the traumatic amputations. Patient's husband on the way and patient has not had any meal since lunch. Patient otherwise ambulatory tolerating p.o. intake and stable for operative intervention at this time. Final Clinical Impression(s) / ED Diagnoses Final diagnoses:  None    Rx / DC Orders ED Discharge Orders    None       Glyn Ade, MD 09/20/20 [id number]    Eber Hong, MD 09/28/20 1719

## 2020-09-20 NOTE — ED Notes (Signed)
Labs and covid test sent to lab at this time

## 2020-09-20 NOTE — ED Notes (Addendum)
Provider at bedside

## 2020-09-20 NOTE — ED Notes (Signed)
Provider at bedside at this time

## 2020-09-20 NOTE — ED Notes (Signed)
Received call and advised to send pt to short stay 37.

## 2020-09-20 NOTE — Anesthesia Preprocedure Evaluation (Signed)
Anesthesia Evaluation  Patient identified by MRN, date of birth, ID band Patient awake    Reviewed: Allergy & Precautions, NPO status , Patient's Chart, lab work & pertinent test results  Airway Mallampati: II  TM Distance: >3 FB Neck ROM: Full    Dental  (+) Dental Advisory Given   Pulmonary asthma , sleep apnea , Current Smoker,    breath sounds clear to auscultation       Cardiovascular hypertension, Pt. on medications  Rhythm:Regular Rate:Normal     Neuro/Psych negative neurological ROS     GI/Hepatic negative GI ROS, Neg liver ROS,   Endo/Other  negative endocrine ROS  Renal/GU negative Renal ROS     Musculoskeletal   Abdominal   Peds  Hematology negative hematology ROS (+)   Anesthesia Other Findings   Reproductive/Obstetrics                            Anesthesia Physical Anesthesia Plan  ASA: II and emergent  Anesthesia Plan: General   Post-op Pain Management:    Induction: Intravenous  PONV Risk Score and Plan: 2 and Dexamethasone, Ondansetron and Treatment may vary due to age or medical condition  Airway Management Planned: Oral ETT  Additional Equipment: None  Intra-op Plan:   Post-operative Plan: Extubation in OR  Informed Consent: I have reviewed the patients History and Physical, chart, labs and discussed the procedure including the risks, benefits and alternatives for the proposed anesthesia with the patient or authorized representative who has indicated his/her understanding and acceptance.     Dental advisory given  Plan Discussed with: CRNA  Anesthesia Plan Comments:         Anesthesia Quick Evaluation

## 2020-09-20 NOTE — ED Triage Notes (Signed)
Pt was replacing the bag on a push mower and hit the blade with her lt hand. Bone exposed to middle and index finger and possible pinky finger. Dressing is applied to area. Pt c/o severe pain at this time

## 2020-09-20 NOTE — ED Notes (Signed)
Pt undressed and placed in a nursing gown. Pt aware of waiting on or at this time. Family is at bedside

## 2020-09-20 NOTE — ED Provider Notes (Signed)
I saw and evaluated the patient, reviewed the resident's note and I agree with the findings and plan.  Pertinent History: Patient is a well-appearing 55 year old female who unfortunately reached her hand, left hand under the lawn more and suffered injuries of the tips of her second third and fourth fingers of the left hand with a distal almost interphalangeal amputation of the third finger and significant soft tissue injury of the other 2 fingers.  I was personally present and directly supervised the following procedures:  Trauma evaluation  Needs hand consultation  I personally interpreted the EKG as well as the resident and agree with the interpretation on the resident's chart.  Final diagnoses:  Shortness of breath   Hand injury  / partial amputation of fingers   Eber Hong, MD 09/28/20 1721

## 2020-09-20 NOTE — H&P (Signed)
Julie Huffman is an 55 y.o. female.   Chief Complaint: Left hand lawnmower injury HPI: 55 year old female states she sustained injury from her lawnmower earlier today.  Injury to the index long and ring fingers.  She was seen at [facility] emergency department where radiographs were taken revealing fractures of the distal phalanges of the index long and ring fingers.  There is amputation of the distal aspect of the long finger.  She reports no previous injury to the fingers and no other injuries at this time.  Case discussed with Tretha Sciara MD and his note from 09/20/2020 reviewed. Xrays viewed and interpreted by me: AP lateral oblique views of the left fingers show amputation of the distal aspect of the long finger through the distal phalanx with some base fragments remaining.  There is a tuft fracture of the ring finger.  There is loss of tuft of the index finger. Labs reviewed: None  Allergies: No Known Allergies  Past Medical History:  Diagnosis Date  . Acute serous otitis media   . Acute upper respiratory infections of unspecified site   . Anxiety disorder   . Asthma   . Benign paroxysmal positional vertigo   . Candidiasis of vulva and vagina   . Dizziness   . Fatigue   . Headache(784.0)   . Hypertension   . Left arm numbness   . Pneumonia, organism unspecified(486)   . Sleep apnea     Past Surgical History:  Procedure Laterality Date  . TUBAL LIGATION      Family History: History reviewed. No pertinent family history.  Social History:   reports that she has been smoking cigarettes. She has a 15.00 pack-year smoking history. She has never used smokeless tobacco. She reports that she does not drink alcohol. No history on file for drug use.  Medications: (Not in a hospital admission)   Results for orders placed or performed during the hospital encounter of 09/20/20 (from the past 48 hour(s))  Resp Panel by RT-PCR (Flu A&B, Covid) Nasopharyngeal Swab     Status:  None   Collection Time: 09/20/20  8:06 PM   Specimen: Nasopharyngeal Swab; Nasopharyngeal(NP) swabs in vial transport medium  Result Value Ref Range   SARS Coronavirus 2 by RT PCR NEGATIVE NEGATIVE    Comment: (NOTE) SARS-CoV-2 target nucleic acids are NOT DETECTED.  The SARS-CoV-2 RNA is generally detectable in upper respiratory specimens during the acute phase of infection. The lowest concentration of SARS-CoV-2 viral copies this assay can detect is 138 copies/mL. A negative result does not preclude SARS-Cov-2 infection and should not be used as the sole basis for treatment or other patient management decisions. A negative result may occur with  improper specimen collection/handling, submission of specimen other than nasopharyngeal swab, presence of viral mutation(s) within the areas targeted by this assay, and inadequate number of viral copies(<138 copies/mL). A negative result must be combined with clinical observations, patient history, and epidemiological information. The expected result is Negative.  Fact Sheet for Patients:  EntrepreneurPulse.com.au  Fact Sheet for Healthcare Providers:  IncredibleEmployment.be  This test is no t yet approved or cleared by the [place] FDA and  has been authorized for detection and/or diagnosis of SARS-CoV-2 by FDA under an Emergency Use Authorization (EUA). This EUA will remain  in effect (meaning this test can be used) for the duration of the COVID-19 declaration under Section 564(b)(1) of the Act, 21 U.S.C.section 360bbb-3(b)(1), unless the authorization is terminated  or revoked sooner.  Influenza A by PCR NEGATIVE NEGATIVE   Influenza B by PCR NEGATIVE NEGATIVE    Comment: (NOTE) The Xpert Xpress SARS-CoV-2/FLU/RSV plus assay is intended as an aid in the diagnosis of influenza from Nasopharyngeal swab specimens and should not be used as a sole basis for treatment. Nasal washings  and aspirates are unacceptable for Xpert Xpress SARS-CoV-2/FLU/RSV testing.  Fact Sheet for Patients: EntrepreneurPulse.com.au  Fact Sheet for Healthcare Providers: IncredibleEmployment.be  This test is not yet approved or cleared by the [place] FDA and has been authorized for detection and/or diagnosis of SARS-CoV-2 by FDA under an Emergency Use Authorization (EUA). This EUA will remain in effect (meaning this test can be used) for the duration of the COVID-19 declaration under Section 564(b)(1) of the Act, 21 U.S.C. section 360bbb-3(b)(1), unless the authorization is terminated or revoked.  Performed at [facility], [place] [address] [place], [place] [address]   CBC     Status: None   Collection Time: 09/20/20  8:24 PM  Result Value Ref Range   WBC 8.1 4.0 - 10.5 K/uL   RBC 4.60 3.87 - 5.11 MIL/uL   Hemoglobin 13.7 12.0 - 15.0 g/dL   HCT 42.7 36.0 - 46.0 %   MCV 92.8 80.0 - 100.0 fL   MCH 29.8 26.0 - 34.0 pg   MCHC 32.1 30.0 - 36.0 g/dL   RDW 12.7 11.5 - 15.5 %   Platelets 255 150 - 400 K/uL   nRBC 0.0 0.0 - 0.2 %    Comment: Performed at [facility], [place] [address], [place], [place] [address]  Basic metabolic panel     Status: Abnormal   Collection Time: 09/20/20  8:24 PM  Result Value Ref Range   Sodium 139 135 - 145 mmol/L   Potassium 3.0 (L) 3.5 - 5.1 mmol/L   Chloride 100 98 - 111 mmol/L   CO2 30 22 - 32 mmol/L   Glucose, Bld 122 (H) 70 - 99 mg/dL    Comment: Glucose reference range applies only to samples taken after fasting for at least 8 hours.   BUN 11 6 - 20 mg/dL   Creatinine, Ser 0.76 0.44 - 1.00 mg/dL   Calcium 8.9 8.9 - 10.3 mg/dL   GFR, Estimated >60 >60 mL/min    Comment: (NOTE) Calculated using the CKD-EPI Creatinine Equation (2021)    Anion gap 9 5 - 15    Comment: Performed at [place] [place] [address], [place], [place] [address]  POC SARS Coronavirus 2 Ag-ED - Nasal Swab (BD  Veritor Kit)     Status: None   Collection Time: 09/20/20 10:50 PM  Result Value Ref Range   SARSCOV2ONAVIRUS 2 AG NEGATIVE NEGATIVE    Comment: (NOTE) SARS-CoV-2 antigen NOT DETECTED.   Negative results are presumptive.  Negative results do not preclude SARS-CoV-2 infection and should not be used as the sole basis for treatment or other patient management decisions, including infection  control decisions, particularly in the presence of clinical signs and  symptoms consistent with COVID-19, or in those who have been in contact with the virus.  Negative results must be combined with clinical observations, patient history, and epidemiological information. The expected result is Negative.  Fact Sheet for Patients: HandmadeRecipes.com.cy  Fact Sheet for Healthcare Providers: FuneralLife.at  This test is not yet approved or cleared by the [place] FDA and  has been authorized for detection and/or diagnosis of SARS-CoV-2 by FDA under an Emergency Use  Authorization (EUA).  This EUA will remain in effect (meaning this test can be used) for the duration of  the COV ID-19 declaration under Section 564(b)(1) of the Act, 21 U.S.C. section 360bbb-3(b)(1), unless the authorization is terminated or revoked sooner.      DG Hand 2 View Left  Result Date: 09/20/2020 CLINICAL DATA:  55 year old female with injury to the left hand with lawnmower. EXAM: LEFT HAND - 2 VIEW COMPARISON:  None. FINDINGS: There is fracture of the base of the distal phalanx of the third digit with amputation of the distal phalanx. There are displaced fractures of the distal tufts of the distal phalanges of the first and fourth digits. There is laceration of the soft tissues of the distal digits and amputation of the distal third digit. There is mild osteopenia. Mild degenerative changes of the base of the thumb. No dislocation. No radiopaque foreign object. IMPRESSION:  Fractures of the distal phalanges of the first, third, and fourth digits as above. Electronically Signed   By: Anner Crete M.D.   On: 09/20/2020 21:01     A comprehensive review of systems was negative. Review of Systems: No fevers, chills, night sweats, chest pain, shortness of breath, nausea, vomiting, diarrhea, constipation, easy bleeding or bruising, headaches, dizziness, vision changes, fainting.   Blood pressure (!) 158/78, pulse 90, temperature 98.6 F (37 C), temperature source Oral, resp. rate 16, height $RemoveBe'5\' 1"'[id number]$  (1.549 m), weight 84.4 kg, SpO2 92 %.  General appearance: alert, cooperative and appears stated age Head: Normocephalic, without obvious abnormality, atraumatic Neck: supple, symmetrical, trachea midline Resp: clear to auscultation bilaterally Cardio: regular rate and rhythm Extremities: Intact sensation and capillary refill all digits.  +epl/fpl/io.  She has lacerations through the distal pad and nail of the index finger.  She has intact sensation in the distal tissue.  There is amputation of the distal aspect of the long finger at the base of the nail.  There is laceration through the pad of the ring finger.  She is able to flex and extend the DIP joints. Pulses: 2+ and symmetric Skin: Skin color, texture, turgor normal. No rashes or lesions Neurologic: Grossly normal Incision/Wound: As above  Assessment/Plan Left index long and ring fingertip lawnmower injuries with amputation of the distal aspect of the long finger.  Recommend operating room for irrigation and debridement of open fractures with revision amputation versus reduction of fracture with repair of skin and nailbed lacerations to index long and ring fingers as necessary.  Risks, benefits and alternatives of surgery were discussed including risks of blood loss, infection, damage to nerves/vessels/tendons/ligament/bone, failure of surgery, need for additional surgery, complication with wound healing, stiffness,  nonunion, malunion.  She voiced understanding of these risks and elected to proceed.    Leanora Cover 09/20/2020, 11:51 PM

## 2020-09-21 ENCOUNTER — Emergency Department (HOSPITAL_COMMUNITY): Payer: BC Managed Care – PPO | Admitting: Anesthesiology

## 2020-09-21 ENCOUNTER — Ambulatory Visit (HOSPITAL_COMMUNITY): Payer: BC Managed Care – PPO

## 2020-09-21 ENCOUNTER — Observation Stay (HOSPITAL_COMMUNITY): Payer: BC Managed Care – PPO

## 2020-09-21 ENCOUNTER — Encounter (HOSPITAL_COMMUNITY): Payer: Self-pay | Admitting: Internal Medicine

## 2020-09-21 DIAGNOSIS — F411 Generalized anxiety disorder: Secondary | ICD-10-CM | POA: Diagnosis present

## 2020-09-21 DIAGNOSIS — G4733 Obstructive sleep apnea (adult) (pediatric): Secondary | ICD-10-CM | POA: Diagnosis present

## 2020-09-21 DIAGNOSIS — Z79899 Other long term (current) drug therapy: Secondary | ICD-10-CM | POA: Diagnosis not present

## 2020-09-21 DIAGNOSIS — G9341 Metabolic encephalopathy: Secondary | ICD-10-CM | POA: Diagnosis present

## 2020-09-21 DIAGNOSIS — S68125A Partial traumatic metacarpophalangeal amputation of left ring finger, initial encounter: Secondary | ICD-10-CM | POA: Diagnosis present

## 2020-09-21 DIAGNOSIS — Z9851 Tubal ligation status: Secondary | ICD-10-CM | POA: Diagnosis not present

## 2020-09-21 DIAGNOSIS — S68123A Partial traumatic metacarpophalangeal amputation of left middle finger, initial encounter: Secondary | ICD-10-CM | POA: Diagnosis present

## 2020-09-21 DIAGNOSIS — Z20822 Contact with and (suspected) exposure to covid-19: Secondary | ICD-10-CM | POA: Diagnosis present

## 2020-09-21 DIAGNOSIS — S62631B Displaced fracture of distal phalanx of left index finger, initial encounter for open fracture: Secondary | ICD-10-CM | POA: Diagnosis present

## 2020-09-21 DIAGNOSIS — J9601 Acute respiratory failure with hypoxia: Secondary | ICD-10-CM | POA: Diagnosis present

## 2020-09-21 DIAGNOSIS — Z8616 Personal history of COVID-19: Secondary | ICD-10-CM | POA: Diagnosis not present

## 2020-09-21 DIAGNOSIS — J9602 Acute respiratory failure with hypercapnia: Secondary | ICD-10-CM | POA: Diagnosis not present

## 2020-09-21 DIAGNOSIS — J440 Chronic obstructive pulmonary disease with acute lower respiratory infection: Secondary | ICD-10-CM | POA: Diagnosis present

## 2020-09-21 DIAGNOSIS — W28XXXA Contact with powered lawn mower, initial encounter: Secondary | ICD-10-CM | POA: Diagnosis not present

## 2020-09-21 DIAGNOSIS — F17210 Nicotine dependence, cigarettes, uncomplicated: Secondary | ICD-10-CM | POA: Diagnosis present

## 2020-09-21 DIAGNOSIS — J45901 Unspecified asthma with (acute) exacerbation: Secondary | ICD-10-CM | POA: Diagnosis not present

## 2020-09-21 DIAGNOSIS — K219 Gastro-esophageal reflux disease without esophagitis: Secondary | ICD-10-CM | POA: Diagnosis present

## 2020-09-21 DIAGNOSIS — Z23 Encounter for immunization: Secondary | ICD-10-CM | POA: Diagnosis not present

## 2020-09-21 DIAGNOSIS — F419 Anxiety disorder, unspecified: Secondary | ICD-10-CM | POA: Diagnosis present

## 2020-09-21 DIAGNOSIS — E876 Hypokalemia: Secondary | ICD-10-CM | POA: Diagnosis present

## 2020-09-21 DIAGNOSIS — J189 Pneumonia, unspecified organism: Secondary | ICD-10-CM

## 2020-09-21 DIAGNOSIS — I10 Essential (primary) hypertension: Secondary | ICD-10-CM | POA: Diagnosis present

## 2020-09-21 DIAGNOSIS — J159 Unspecified bacterial pneumonia: Secondary | ICD-10-CM | POA: Diagnosis present

## 2020-09-21 LAB — COMPREHENSIVE METABOLIC PANEL
ALT: 19 U/L (ref 0–44)
AST: 29 U/L (ref 15–41)
Albumin: 2.9 g/dL — ABNORMAL LOW (ref 3.5–5.0)
Alkaline Phosphatase: 63 U/L (ref 38–126)
Anion gap: 7 (ref 5–15)
BUN: 7 mg/dL (ref 6–20)
CO2: 32 mmol/L (ref 22–32)
Calcium: 8.6 mg/dL — ABNORMAL LOW (ref 8.9–10.3)
Chloride: 101 mmol/L (ref 98–111)
Creatinine, Ser: 0.68 mg/dL (ref 0.44–1.00)
GFR, Estimated: >60 mL/min (ref >60)
Glucose, Bld: 122 mg/dL — ABNORMAL HIGH (ref 70–99)
Potassium: 3.6 mmol/L (ref 3.5–5.1)
Sodium: 140 mmol/L (ref 135–145)
Total Bilirubin: 0.5 mg/dL (ref 0.3–1.2)
Total Protein: 5.3 g/dL — ABNORMAL LOW (ref 6.5–8.1)

## 2020-09-21 LAB — CBC WITH DIFFERENTIAL/PLATELET
Abs Immature Granulocytes: 0.05 10*3/uL (ref 0.00–0.07)
Basophils Absolute: 0 10*3/uL (ref 0.0–0.1)
Basophils Relative: 0 %
Eosinophils Absolute: 0.1 10*3/uL (ref 0.0–0.5)
Eosinophils Relative: 0 %
HCT: 37.4 % (ref 36.0–46.0)
Hemoglobin: 12 g/dL (ref 12.0–15.0)
Immature Granulocytes: 0 %
Lymphocytes Relative: 19 %
Lymphs Abs: 2.5 10*3/uL (ref 0.7–4.0)
MCH: 29.8 pg (ref 26.0–34.0)
MCHC: 32.1 g/dL (ref 30.0–36.0)
MCV: 92.8 fL (ref 80.0–100.0)
Monocytes Absolute: 0.9 10*3/uL (ref 0.1–1.0)
Monocytes Relative: 6 %
Neutro Abs: 10 10*3/uL — ABNORMAL HIGH (ref 1.7–7.7)
Neutrophils Relative %: 75 %
Platelets: 214 10*3/uL (ref 150–400)
RBC: 4.03 MIL/uL (ref 3.87–5.11)
RDW: 12.8 % (ref 11.5–15.5)
WBC: 13.5 10*3/uL — ABNORMAL HIGH (ref 4.0–10.5)
nRBC: 0 % (ref 0.0–0.2)

## 2020-09-21 LAB — BLOOD GAS, ARTERIAL
Acid-Base Excess: 4.4 mmol/L — ABNORMAL HIGH (ref 0.0–2.0)
Acid-Base Excess: 5.6 mmol/L — ABNORMAL HIGH (ref 0.0–2.0)
Bicarbonate: 30.8 mmol/L — ABNORMAL HIGH (ref 20.0–28.0)
Bicarbonate: 30.8 mmol/L — ABNORMAL HIGH (ref 20.0–28.0)
Drawn by: 59076
Drawn by: 51185
FIO2: 24
FIO2: 40
O2 Saturation: 96 %
O2 Saturation: 96.3 %
Patient temperature: 36.4
Patient temperature: 37
pCO2 arterial: 66.7 mmHg (ref 32.0–48.0)
pCO2 arterial: 56.1 mmHg — ABNORMAL HIGH (ref 32.0–48.0)
pH, Arterial: 7.282 — ABNORMAL LOW (ref 7.350–7.450)
pH, Arterial: 7.359 (ref 7.350–7.450)
pO2, Arterial: 84.4 mmHg (ref 83.0–108.0)
pO2, Arterial: 80.9 mmHg — ABNORMAL LOW (ref 83.0–108.0)

## 2020-09-21 LAB — C-REACTIVE PROTEIN: CRP: 0.6 mg/dL (ref <1.0)

## 2020-09-21 LAB — PROCALCITONIN: Procalcitonin: <0.1 ng/mL

## 2020-09-21 LAB — HIV ANTIBODY (ROUTINE TESTING W REFLEX): HIV Screen 4th Generation wRfx: NONREACTIVE

## 2020-09-21 LAB — BRAIN NATRIURETIC PEPTIDE: B Natriuretic Peptide: 34 pg/mL (ref 0.0–100.0)

## 2020-09-21 LAB — MAGNESIUM: Magnesium: 1.7 mg/dL (ref 1.7–2.4)

## 2020-09-21 MED ORDER — ONDANSETRON HCL 4 MG/2ML IJ SOLN: 4.0000 mg | Freq: Four times a day (QID) | INTRAMUSCULAR | Status: DC | PRN

## 2020-09-21 MED ORDER — ROCURONIUM BROMIDE 10 MG/ML (PF) SYRINGE
PREFILLED_SYRINGE | INTRAVENOUS | Status: AC
  Filled 2020-09-21: qty 20

## 2020-09-21 MED ORDER — POTASSIUM CHLORIDE 10 MEQ/100ML IV SOLN
10.0000 meq | INTRAVENOUS | Status: AC
  Administered 2020-09-21 (×5): 10 meq via INTRAVENOUS
  Filled 2020-09-21 (×5): qty 100

## 2020-09-21 MED ORDER — PROMETHAZINE HCL 25 MG/ML IJ SOLN: 6.2500 mg | INTRAMUSCULAR | Status: DC | PRN

## 2020-09-21 MED ORDER — SODIUM CHLORIDE 0.9 % IR SOLN
Status: DC | PRN
  Administered 2020-09-21: 3000 mL

## 2020-09-21 MED ORDER — ACETAMINOPHEN 650 MG RE SUPP: 650.0000 mg | Freq: Four times a day (QID) | RECTAL | Status: DC | PRN

## 2020-09-21 MED ORDER — PHENYLEPHRINE HCL (PRESSORS) 10 MG/ML IV SOLN
INTRAVENOUS | Status: DC | PRN
  Administered 2020-09-21: 80 ug via INTRAVENOUS

## 2020-09-21 MED ORDER — FENTANYL CITRATE (PF) 250 MCG/5ML IJ SOLN
INTRAMUSCULAR | Status: DC | PRN
  Administered 2020-09-20 – 2020-09-21 (×2): 100 ug via INTRAVENOUS
  Administered 2020-09-21: 50 ug via INTRAVENOUS

## 2020-09-21 MED ORDER — NALOXONE HCL 0.4 MG/ML IJ SOLN
0.2000 mg | Freq: Once | INTRAMUSCULAR | Status: DC
  Filled 2020-09-21: qty 1

## 2020-09-21 MED ORDER — PROPOFOL 10 MG/ML IV BOLUS
INTRAVENOUS | Status: DC | PRN
  Administered 2020-09-21: 150 mg via INTRAVENOUS

## 2020-09-21 MED ORDER — LIDOCAINE 2% (20 MG/ML) 5 ML SYRINGE
INTRAMUSCULAR | Status: AC
  Filled 2020-09-21: qty 10

## 2020-09-21 MED ORDER — SUCCINYLCHOLINE CHLORIDE 200 MG/10ML IV SOSY
PREFILLED_SYRINGE | INTRAVENOUS | Status: AC
  Filled 2020-09-21: qty 20

## 2020-09-21 MED ORDER — OXYCODONE-ACETAMINOPHEN 5-325 MG PO TABS: 1.0000 | ORAL_TABLET | ORAL | Status: DC | PRN

## 2020-09-21 MED ORDER — IOHEXOL 300 MG/ML  SOLN
80.0000 mL | Freq: Once | INTRAMUSCULAR | Status: AC | PRN
  Administered 2020-09-21: 80 mL via INTRAVENOUS

## 2020-09-21 MED ORDER — ESCITALOPRAM OXALATE 10 MG PO TABS
10.0000 mg | ORAL_TABLET | Freq: Every day | ORAL | Status: DC
  Administered 2020-09-21 – 2020-09-22 (×2): 10 mg via ORAL
  Filled 2020-09-21 (×2): qty 1

## 2020-09-21 MED ORDER — CHLORHEXIDINE GLUCONATE 0.12 % MT SOLN
15.0000 mL | Freq: Two times a day (BID) | OROMUCOSAL | Status: DC
  Administered 2020-09-21 – 2020-09-22 (×3): 15 mL via OROMUCOSAL
  Filled 2020-09-21 (×3): qty 15

## 2020-09-21 MED ORDER — ALBUTEROL SULFATE (2.5 MG/3ML) 0.083% IN NEBU
2.5000 mg | INHALATION_SOLUTION | Freq: Once | RESPIRATORY_TRACT | Status: AC
  Administered 2020-09-21: 2.5 mg via RESPIRATORY_TRACT

## 2020-09-21 MED ORDER — LIDOCAINE HCL (CARDIAC) PF 100 MG/5ML IV SOSY
PREFILLED_SYRINGE | INTRAVENOUS | Status: DC | PRN
  Administered 2020-09-21: 60 mg via INTRATRACHEAL

## 2020-09-21 MED ORDER — SULFAMETHOXAZOLE-TRIMETHOPRIM 800-160 MG PO TABS
1.0000 | ORAL_TABLET | Freq: Two times a day (BID) | ORAL | Status: DC
  Administered 2020-09-21 – 2020-09-22 (×3): 1 via ORAL
  Filled 2020-09-21 (×3): qty 1

## 2020-09-21 MED ORDER — CEFAZOLIN SODIUM-DEXTROSE 2-3 GM-%(50ML) IV SOLR
INTRAVENOUS | Status: DC | PRN
  Administered 2020-09-21: 2 g via INTRAVENOUS

## 2020-09-21 MED ORDER — 0.9 % SODIUM CHLORIDE (POUR BTL) OPTIME
TOPICAL | Status: DC | PRN
  Administered 2020-09-21: 1000 mL

## 2020-09-21 MED ORDER — ALPRAZOLAM 0.5 MG PO TABS
1.0000 mg | ORAL_TABLET | Freq: Two times a day (BID) | ORAL | Status: DC | PRN
  Administered 2020-09-21: 1 mg via ORAL
  Filled 2020-09-21: qty 2

## 2020-09-21 MED ORDER — SODIUM CHLORIDE 0.9 % IV SOLN
500.0000 mg | INTRAVENOUS | Status: DC
  Administered 2020-09-21: 500 mg via INTRAVENOUS
  Filled 2020-09-21 (×3): qty 500

## 2020-09-21 MED ORDER — ONDANSETRON HCL 4 MG/2ML IJ SOLN
INTRAMUSCULAR | Status: AC
  Filled 2020-09-21: qty 4

## 2020-09-21 MED ORDER — MIDAZOLAM HCL 2 MG/2ML IJ SOLN
1.0000 mg | Freq: Once | INTRAMUSCULAR | Status: AC
  Administered 2020-09-21: 1 mg via INTRAVENOUS

## 2020-09-21 MED ORDER — ALBUTEROL SULFATE HFA 108 (90 BASE) MCG/ACT IN AERS
INHALATION_SPRAY | RESPIRATORY_TRACT | Status: DC | PRN
  Administered 2020-09-21: 4 via RESPIRATORY_TRACT

## 2020-09-21 MED ORDER — SODIUM CHLORIDE 0.9 % IV SOLN
2.0000 g | INTRAVENOUS | Status: DC
  Administered 2020-09-21: 2 g via INTRAVENOUS
  Filled 2020-09-21: qty 2

## 2020-09-21 MED ORDER — HYDROCODONE-ACETAMINOPHEN 5-325 MG PO TABS: ORAL_TABLET | ORAL | 0 refills | Status: AC

## 2020-09-21 MED ORDER — ACETAMINOPHEN 325 MG PO TABS
650.0000 mg | ORAL_TABLET | Freq: Four times a day (QID) | ORAL | Status: DC | PRN
  Filled 2020-09-21: qty 2

## 2020-09-21 MED ORDER — FENTANYL CITRATE (PF) 100 MCG/2ML IJ SOLN
50.0000 ug | Freq: Once | INTRAMUSCULAR | Status: DC
  Filled 2020-09-21: qty 2

## 2020-09-21 MED ORDER — POLYETHYLENE GLYCOL 3350 17 G PO PACK: 17.0000 g | PACK | Freq: Every day | ORAL | Status: DC | PRN

## 2020-09-21 MED ORDER — SULFAMETHOXAZOLE-TRIMETHOPRIM 800-160 MG PO TABS: 1.0000 | ORAL_TABLET | Freq: Two times a day (BID) | ORAL | 0 refills | Status: AC

## 2020-09-21 MED ORDER — KETOROLAC TROMETHAMINE 30 MG/ML IJ SOLN
30.0000 mg | Freq: Once | INTRAMUSCULAR | Status: DC
  Filled 2020-09-21: qty 1

## 2020-09-21 MED ORDER — OXYCODONE-ACETAMINOPHEN 5-325 MG PO TABS
1.0000 | ORAL_TABLET | Freq: Four times a day (QID) | ORAL | Status: DC | PRN
  Administered 2020-09-21 – 2020-09-22 (×3): 1 via ORAL
  Filled 2020-09-21 (×3): qty 1

## 2020-09-21 MED ORDER — ALBUTEROL SULFATE (2.5 MG/3ML) 0.083% IN NEBU
INHALATION_SOLUTION | RESPIRATORY_TRACT | Status: AC
  Filled 2020-09-21: qty 3

## 2020-09-21 MED ORDER — SUCCINYLCHOLINE CHLORIDE 20 MG/ML IJ SOLN
INTRAMUSCULAR | Status: DC | PRN
  Administered 2020-09-21: 100 mg via INTRAVENOUS

## 2020-09-21 MED ORDER — FENTANYL CITRATE (PF) 100 MCG/2ML IJ SOLN: 25.0000 ug | INTRAMUSCULAR | Status: DC | PRN

## 2020-09-21 MED ORDER — CEFAZOLIN SODIUM 1 G IJ SOLR
INTRAMUSCULAR | Status: AC
  Filled 2020-09-21: qty 20

## 2020-09-21 MED ORDER — LACTATED RINGERS IV SOLN: INTRAVENOUS | Status: DC | PRN

## 2020-09-21 MED ORDER — METHYLPREDNISOLONE SODIUM SUCC 125 MG IJ SOLR
60.0000 mg | Freq: Two times a day (BID) | INTRAMUSCULAR | Status: DC
  Administered 2020-09-21 (×2): 60 mg via INTRAVENOUS
  Filled 2020-09-21 (×2): qty 2

## 2020-09-21 MED ORDER — ONDANSETRON HCL 4 MG PO TABS: 4.0000 mg | ORAL_TABLET | Freq: Four times a day (QID) | ORAL | Status: DC | PRN

## 2020-09-21 MED ORDER — BUPIVACAINE HCL (PF) 0.25 % IJ SOLN
INTRAMUSCULAR | Status: DC | PRN
  Administered 2020-09-21: 15 mL

## 2020-09-21 MED ORDER — MIDAZOLAM HCL 2 MG/2ML IJ SOLN
INTRAMUSCULAR | Status: AC
  Filled 2020-09-21: qty 2

## 2020-09-21 MED ORDER — MORPHINE SULFATE (PF) 2 MG/ML IV SOLN
2.0000 mg | INTRAVENOUS | Status: DC | PRN
  Administered 2020-09-21: 2 mg via INTRAVENOUS
  Filled 2020-09-21: qty 1

## 2020-09-21 MED ORDER — ORAL CARE MOUTH RINSE
15.0000 mL | Freq: Two times a day (BID) | OROMUCOSAL | Status: DC
  Administered 2020-09-21 – 2020-09-22 (×3): 15 mL via OROMUCOSAL

## 2020-09-21 MED ORDER — ONDANSETRON HCL 4 MG/2ML IJ SOLN
INTRAMUSCULAR | Status: DC | PRN
  Administered 2020-09-21: 4 mg via INTRAVENOUS

## 2020-09-21 MED ORDER — IPRATROPIUM-ALBUTEROL 0.5-2.5 (3) MG/3ML IN SOLN
3.0000 mL | Freq: Four times a day (QID) | RESPIRATORY_TRACT | Status: DC
  Administered 2020-09-21 (×2): 3 mL via RESPIRATORY_TRACT
  Filled 2020-09-21 (×2): qty 3

## 2020-09-21 MED ORDER — IPRATROPIUM-ALBUTEROL 0.5-2.5 (3) MG/3ML IN SOLN
3.0000 mL | RESPIRATORY_TRACT | Status: DC | PRN
  Filled 2020-09-21: qty 3

## 2020-09-21 MED ORDER — PHENYLEPHRINE 40 MCG/ML (10ML) SYRINGE FOR IV PUSH (FOR BLOOD PRESSURE SUPPORT)
PREFILLED_SYRINGE | INTRAVENOUS | Status: AC
  Filled 2020-09-21: qty 30

## 2020-09-21 MED ORDER — ALPRAZOLAM 0.5 MG PO TABS
0.5000 mg | ORAL_TABLET | Freq: Two times a day (BID) | ORAL | Status: DC | PRN
  Administered 2020-09-22: 0.5 mg via ORAL
  Filled 2020-09-21: qty 1

## 2020-09-21 MED ORDER — MIDAZOLAM HCL 5 MG/5ML IJ SOLN
INTRAMUSCULAR | Status: DC | PRN
  Administered 2020-09-20: 2 mg via INTRAVENOUS

## 2020-09-21 MED ORDER — HEPARIN SODIUM (PORCINE) 5000 UNIT/ML IJ SOLN
5000.0000 [IU] | Freq: Three times a day (TID) | INTRAMUSCULAR | Status: DC
  Administered 2020-09-21 (×2): 5000 [IU] via SUBCUTANEOUS
  Filled 2020-09-21 (×2): qty 1

## 2020-09-21 NOTE — Progress Notes (Signed)
Shift note:  AOx4. On BiPap at start of shift, switched to 2L [place] and tolerated well. Pt c/o pain to left arm at beginning of shift, was given one dose of PRN morphine. Denied pain the rest of shift. Encouraged pt to stay awake and sit in chair as much as possible. Around 1800 pt very sleepy and refusing to get out of bed to chair for dinner, MD notified. After getting up to chair, pt very awake and alert, MD aware. Continue to monitor.

## 2020-09-21 NOTE — Progress Notes (Signed)
Writer was reassessing pain level for pt, pt was noted sitting on the side of the bed grimmacing holding (L) hand. Pt stated that pain medication given earlier was ineffective. Spoke with on call provider and was given order for 1x dose of Tordol and Fentanyl. Pt was given both medication via iv. Will reassess pain level at appropriate time and tx as indicated.

## 2020-09-21 NOTE — Progress Notes (Signed)
PROGRESS NOTE                                                                                                                                                                                                             Patient Demographics:    Julie Huffman, is a 55 y.o. female, DOB - 09/12/1965, ZOX:[id number]  Outpatient Primary MD for the patient is Richmond CampbellKaplan, Kristen W., PA-C    LOS - 0  Admit date - 09/20/2020    Chief Complaint  Patient presents with  . Hand Injury       Brief Narrative (HPI from H&P) 55 year old female with past medical history of anxiety disorder, asthma, gastroesophageal reflux disease, obstructive sleep apnea who [facility] to [facility]  emergency department after injuring her left hand with her lawnmower, she was seen by hand surgeon Dr. Merlyn LotKuzma and underwent left long finger and ring finger revision amputation along with repair of skin laceration, postop she was not tolerating the extubation well was found to be in acute hypercapnic respiratory failure placed on BiPAP, there was also question of pneumonia and she was started on antibiotics.   Subjective:    Julie Huffman today has, No headache, No chest pain, No abdominal pain - No Nausea, No new weakness tingling or numbness, no SOB.   Assessment  & Plan :    1. 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 Merlyn LotKuzma.  Keep the site clean and dry follow-up with Dr. Merlyn LotKuzma within a week of discharge  2.  Metabolic encephalopathy caused by acute hypercapnic respiratory failure due to combination of sedation, underlying OSA and obesity.  She is on BiPAP and clinically improving, mental status is improved, will challenge her with nasal cannula oxygen, sitting up in chair and supportive care, nighttime CPAP, patient  has a CPAP machine at home but does not use it for several years.  Counseled on compliance.  Monitor closely.  3.  Left lower lobe infiltrate along with some wheezing prior to hospital admission.  Likely community-acquired pneumonia along with worsening of asthma due to seasonal allergies.  Steroids and antibiotics, supportive care with nebulizer treatments, noninvasive positive pressure ventilation as needed.  4.  Generalized anxiety  disorder.  Minimize benzodiazepine use.  5.  GERD.  PPI.        Condition - Extremely Guarded  Family Communication  : Husband Thayer Ohm [phone] on 09/21/2020 at 9 AM  Code Status :  Full  Consults  :  Hand Surgery  PUD Prophylaxis :    Procedures  :     1.  Left index finger irrigation debridement of open distal phalanx fracture 2.  Left index finger repair of skin and nailbed lacerations 3.  Left long finger revision amputation 4.  Left ring finger revision amputation  SURGEON: Betha Loa, M.D. on 09/21/20      Disposition Plan  :    Dispo: The patient is from: Home              Anticipated d/c is to: Home              Patient currently is not medically stable to d/c.   Difficult to place patient No   DVT Prophylaxis  :    heparin injection 5,000 Units Start: 09/21/20 1400 SCDs Start: 09/21/20 0606  Lovenox  Start 09/21/20  Lab Results  Component Value Date   PLT 214 09/21/2020    Diet :  Diet Order            Diet regular Room service appropriate? Yes; Fluid consistency: Thin  Diet effective now                  Inpatient Medications  Scheduled Meds: . albuterol      . chlorhexidine  15 mL Mouth Rinse BID  . escitalopram  10 mg Oral Daily  . heparin injection (subcutaneous)  5,000 Units Subcutaneous Q8H  . ipratropium-albuterol  3 mL Nebulization Q6H  . mouth rinse  15 mL Mouth Rinse q12n4p  . methylPREDNISolone (SOLU-MEDROL) injection  60 mg Intravenous Q12H  . midazolam       Continuous Infusions: .  azithromycin 500 mg (09/21/20 0859)  . cefTRIAXone (ROCEPHIN)  IV 2 g (09/21/20 0631)  . potassium chloride 10 mEq (09/21/20 0832)   PRN Meds:.acetaminophen **OR** [DISCONTINUED] acetaminophen, [START ON 09/22/2020] ALPRAZolam, ipratropium-albuterol, [DISCONTINUED] ondansetron **OR** ondansetron (ZOFRAN) IV, oxyCODONE-acetaminophen **OR** [DISCONTINUED]  morphine injection, polyethylene glycol  Antibiotics  :    Anti-infectives (From admission, onward)   Start     Dose/Rate Route Frequency Ordered Stop   09/21/20 0800  azithromycin (ZITHROMAX) 500 mg in sodium chloride 0.9 % 250 mL IVPB        500 mg 250 mL/hr over 60 Minutes Intravenous Every 24 hours 09/21/20 0518 09/26/20 0759   09/21/20 0600  cefTRIAXone (ROCEPHIN) 2 g in sodium chloride 0.9 % 100 mL IVPB        2 g 200 mL/hr over 30 Minutes Intravenous Every 24 hours 09/21/20 0518 09/26/20 0559   09/21/20 0000  sulfamethoxazole-trimethoprim (BACTRIM DS) 800-160 MG tablet        1 tablet Oral 2 times daily 09/21/20 0158         Time Spent in minutes  30   Susa Raring M.D on 09/21/2020 at 9:19 AM  To page go to [email or web address]   [facility] -  Office  [phone]   See all Orders from today for further details    Objective:   Vitals:   09/21/20 0455 09/21/20 0503 09/21/20 0745 09/21/20 0850  BP:  125/71 123/65 128/69  Pulse: 82 79 86 83  Resp: Temp:  97.6 F (  36.4 C) 98.4 F (36.9 C)   TempSrc:  Oral Axillary   SpO2: 97% 100% 95% 97%  Weight:      Height:        Wt Readings from Last 3 Encounters:  09/20/20 84.4 kg  06/15/12 95.3 kg  05/11/12 90.7 kg     Intake/Output Summary (Last 24 hours) at 09/21/2020 0919 Last data filed at 09/21/2020 0134 Gross per 24 hour  Intake 1050 ml  Output 25 ml  Net 1025 ml     Physical Exam  Awake minimally drowsy, no FN deficits [place].AT,PERRAL Supple Neck,No JVD, No cervical lymphadenopathy appriciated.  Symmetrical Chest wall movement, Good air  movement bilaterally, CTAB RRR,No Gallops,Rubs or new Murmurs, No Parasternal Heave +ve B.Sounds, Abd Soft, No tenderness, No organomegaly appriciated, No rebound - guarding or rigidity. No Cyanosis, L. Hand in bandage     Data Review:    CBC Recent Labs  Lab 09/20/20 2024 09/21/20 0635  WBC 8.1 13.5*  HGB 13.7 12.0  HCT 42.7 37.4  PLT 255 214  MCV 92.8 92.8  MCH 29.8 29.8  MCHC 32.1 32.1  RDW 12.7 12.8  LYMPHSABS  --  2.5  MONOABS  --  0.9  EOSABS  --  0.1  BASOSABS  --  0.0    Recent Labs  Lab 09/20/20 2024 09/21/20 0635 09/21/20 0751  NA 139 140  --   K 3.0* 3.6  --   CL 100 101  --   CO2 30 32  --   GLUCOSE 122* 122*  --   BUN 11 7  --   CREATININE 0.76 0.68  --   CALCIUM 8.9 8.6*  --   AST  --  29  --   ALT  --  19  --   ALKPHOS  --  63  --   BILITOT  --  0.5  --   ALBUMIN  --  2.9*  --   MG  --  1.7  --   CRP  --   --  0.6  BNP  --   --  34.0    ------------------------------------------------------------------------------------------------------------------ No results for input(s): CHOL, HDL, LDLCALC, TRIG, CHOLHDL, LDLDIRECT in the last 72 hours.  No results found for: HGBA1C ------------------------------------------------------------------------------------------------------------------ No results for input(s): TSH, T4TOTAL, T3FREE, THYROIDAB in the last 72 hours.  Invalid input(s): FREET3  Cardiac Enzymes No results for input(s): CKMB, TROPONINI, MYOGLOBIN in the last 168 hours.  Invalid input(s): CK ------------------------------------------------------------------------------------------------------------------    Component Value Date/Time   BNP 34.0 09/21/2020 0751    Micro Results Recent Results (from the past 240 hour(s))  Resp Panel by RT-PCR (Flu A&B, Covid) Nasopharyngeal Swab     Status: None   Collection Time: 09/20/20  8:06 PM   Specimen: Nasopharyngeal Swab; Nasopharyngeal(NP) swabs in vial transport medium  Result  Value Ref Range Status   SARS Coronavirus 2 by RT PCR NEGATIVE NEGATIVE Final    Comment: (NOTE) SARS-CoV-2 target nucleic acids are NOT DETECTED.  The SARS-CoV-2 RNA is generally detectable in upper respiratory specimens during the acute phase of infection. The lowest concentration of SARS-CoV-2 viral copies this assay can detect is 138 copies/mL. A negative result does not preclude SARS-Cov-2 infection and should not be used as the sole basis for treatment or other patient management decisions. A negative result may occur with  improper specimen collection/handling, submission of specimen other than nasopharyngeal swab, presence of viral mutation(s) within the areas targeted by this assay, and inadequate number  of viral copies(<138 copies/mL). A negative result must be combined with clinical observations, patient history, and epidemiological information. The expected result is Negative.  Fact Sheet for Patients:  BloggerCourse.com  Fact Sheet for Healthcare Providers:  SeriousBroker.it  This test is no t yet approved or cleared by the [place] FDA and  has been authorized for detection and/or diagnosis of SARS-CoV-2 by FDA under an Emergency Use Authorization (EUA). This EUA will remain  in effect (meaning this test can be used) for the duration of the COVID-19 declaration under Section 564(b)(1) of the Act, 21 U.S.C.section 360bbb-3(b)(1), unless the authorization is terminated  or revoked sooner.       Influenza A by PCR NEGATIVE NEGATIVE Final   Influenza B by PCR NEGATIVE NEGATIVE Final    Comment: (NOTE) The Xpert Xpress SARS-CoV-2/FLU/RSV plus assay is intended as an aid in the diagnosis of influenza from Nasopharyngeal swab specimens and should not be used as a sole basis for treatment. Nasal washings and aspirates are unacceptable for Xpert Xpress SARS-CoV-2/FLU/RSV testing.  Fact Sheet for  Patients: BloggerCourse.com  Fact Sheet for Healthcare Providers: SeriousBroker.it  This test is not yet approved or cleared by the [place] FDA and has been authorized for detection and/or diagnosis of SARS-CoV-2 by FDA under an Emergency Use Authorization (EUA). This EUA will remain in effect (meaning this test can be used) for the duration of the COVID-19 declaration under Section 564(b)(1) of the Act, 21 U.S.C. section 360bbb-3(b)(1), unless the authorization is terminated or revoked.  Performed at [facility], [address], [place], [place] [address]     Radiology Reports DG Chest 1 View  Result Date: 09/21/2020 CLINICAL DATA:  Dyspnea EXAM: CHEST  1 VIEW COMPARISON:  None. FINDINGS: Lung volumes are small but are symmetric. Left perihilar asymmetric interstitial pulmonary infiltrate is present, likely infectious in the acute setting. No pneumothorax or pleural effusion. Cardiac size within normal limits. No acute bone abnormality. IMPRESSION: Left perihilar focal pulmonary infiltrate, likely infectious in the acute setting. Electronically Signed   By: Helyn Numbers MD   On: 09/21/2020 03:07   CT CHEST W CONTRAST  Result Date: 09/21/2020 CLINICAL DATA:  55 year old female with shortness of breath. Abnormal left perihilar density on x-ray. EXAM: CT CHEST WITH CONTRAST TECHNIQUE: Multidetector CT imaging of the chest was performed during intravenous contrast administration. CONTRAST:  68mL OMNIPAQUE IOHEXOL 300 MG/ML  SOLN COMPARISON:  Portable chest 0301 hours today. FINDINGS: Cardiovascular: No cardiomegaly or pericardial effusion. Negative thoracic aorta, minimal atherosclerosis. Main pulmonary arteries appear patent. No calcified coronary artery atherosclerosis is evident. Mediastinum/Nodes: Negative.  No lymphadenopathy. Lungs/Pleura: Major airways are patent. There is segmental consolidation in the left lower lobe  superior and posterior basal segments with air bronchograms. Similar but lesser streaky opacity in the right lower lobe. Additional nonspecific and subtle left upper lobe ground-glass and streaky peribronchial opacity (series 4, images 32 and 44). Right upper lobe, right middle lobe and lingula appear spared. No pleural effusion. Upper Abdomen: Negative visible liver, gallbladder, spleen, pancreas, adrenal glands, and bowel in the upper abdomen. Negative visible left kidney. Mild right renal midpole scarring suspected. Musculoskeletal: Thoracic disc degeneration. Lower thoracic vacuum disc. No acute or suspicious osseous lesion identified. Other findings: Ptosis of the left submandibular gland suspected and visible at the left thoracic inlet on series 3 image 9. Contralateral right submandibular gland suspected on image 3. No definite thoracic inlet lymphadenopathy. No axillary lymphadenopathy. IMPRESSION: 1. Segmental consolidation in the left  lower lobe most compatible with pneumonia. Additional nonspecific left upper lobe ground-glass and streaky peribronchial opacity may be early infectious involvement. No pleural effusion. 2. Mild pulmonary atelectasis, scarring otherwise. 3. Mediastinum and upper abdomen appear negative. Electronically Signed   By: Odessa Fleming M.D.   On: 09/21/2020 05:06   DG Hand 2 View Left  Result Date: 09/20/2020 CLINICAL DATA:  55 year old female with injury to the left hand with lawnmower. EXAM: LEFT HAND - 2 VIEW COMPARISON:  None. FINDINGS: There is fracture of the base of the distal phalanx of the third digit with amputation of the distal phalanx. There are displaced fractures of the distal tufts of the distal phalanges of the first and fourth digits. There is laceration of the soft tissues of the distal digits and amputation of the distal third digit. There is mild osteopenia. Mild degenerative changes of the base of the thumb. No dislocation. No radiopaque foreign object.  IMPRESSION: Fractures of the distal phalanges of the first, third, and fourth digits as above. Electronically Signed   By: Elgie Collard M.D.   On: 09/20/2020 21:01

## 2020-09-21 NOTE — Progress Notes (Signed)
[facility] night shift.  The staff reported that the patient is having 7/10 pain in her injured in her left hand that has not been relieved by Percocet.  Fentanyl 50 mcg IVP x1 and Toradol 30 mg IVP x1 ordered.  Sanda Klein, MD.

## 2020-09-21 NOTE — Anesthesia Postprocedure Evaluation (Signed)
Anesthesia Post Note  Patient: Julie Huffman  Procedure(s) Performed: REVISION AMPUTATION OF LEFT , LONG, RING FINGERS  REPAIR SKIN AND NAIL BED LEFT INDEX FINGER (Left Finger) IRRIGATION AND DEBRIDEMENT LEFT INDEX, LONG, RING FINGERS (Left Finger)     Patient location during evaluation: PACU Anesthesia Type: General Level of consciousness: awake and alert Pain management: pain level controlled Vital Signs Assessment: post-procedure vital signs reviewed and stable Respiratory status: spontaneous breathing, nonlabored ventilation and respiratory function stable (CPAP ) Cardiovascular status: blood pressure returned to baseline and stable Postop Assessment: no apparent nausea or vomiting Anesthetic complications: no   No complications documented.  Last Vitals:  Vitals:   09/21/20 0455 09/21/20 0503  BP:  125/71  Pulse: 82 79  Resp: 18 16  Temp:  36.4 C  SpO2: 97% 100%    Last Pain:  Vitals:   09/21/20 0503  TempSrc: Oral  PainSc: 0-No pain                 Kennieth Rad

## 2020-09-21 NOTE — TOC CAGE-AID Note (Signed)
Transition of Care [facility]) - CAGE-AID Screening   Patient Details  Name: Julie Huffman MRN: [id number] Date of Birth: [birth year]   Clinical Narrative:  Patient denies any alcohol or drug use, no need for substance abuse education at this time.  CAGE-AID Screening:    Have You Ever Felt You Ought to Cut Down on Your Drinking or Drug Use?: No Have People Annoyed You By Critizing Your Drinking Or Drug Use?: No Have You Felt Bad Or Guilty About Your Drinking Or Drug Use?: No Have You Ever Had a Drink or Used Drugs First Thing In The Morning to Steady Your Nerves or to Get Rid of a Hangover?: No CAGE-AID Score: 0  Substance Abuse Education Offered: No

## 2020-09-21 NOTE — Op Note (Signed)
NAME: Julie Huffman MEDICAL RECORD NO: [id number] DATE OF BIRTH: 1965/06/28 FACILITY: [facility] LOCATION: [facility] OR PHYSICIAN: Tami Ribas, MD   OPERATIVE REPORT   DATE OF PROCEDURE: 09/21/20    PREOPERATIVE DIAGNOSIS:   Left index long and ring fingertip lawnmower injuries   POSTOPERATIVE DIAGNOSIS:   1.  Left index finger open distal phalanx fracture with skin and nailbed laceration 2.  Left long finger amputation 3.  Left ring finger amputation   PROCEDURE:   1.  Left index finger irrigation debridement of open distal phalanx fracture 2.  Left index finger repair of skin and nailbed lacerations 3.  Left long finger revision amputation 4.  Left ring finger revision amputation   SURGEON:  Betha Loa, M.D.   ASSISTANT: none   ANESTHESIA:  General   INTRAVENOUS FLUIDS:  Per anesthesia flow sheet.   ESTIMATED BLOOD LOSS:  Minimal.   COMPLICATIONS:  None.   SPECIMENS:  none   TOURNIQUET TIME:   * Missing tourniquet times found for documented tourniquets in log: [id number] * Total Tourniquet Time Documented: Upper Arm (Left) - 68 minutes Total: Upper Arm (Left) - 68 minutes    DISPOSITION:  Stable to PACU.   INDICATIONS: 55 year old right-hand-dominant female states that yesterday evening she sustained a lawnmower injury to the left index long and ring fingers.  She is seen at [facility] emergency department where radiographs were taken revealing fractures of the distal phalanges of the left index long and ring fingers with loss of bone in the long finger distal phalanx. Risks, benefits and alternatives of surgery were discussed including the risks of blood loss, infection, damage to nerves, vessels, tendons, ligaments, bone for surgery, need for additional surgery, complications with wound healing, continued pain, stiffness.  She voiced understanding of these risks and elected to proceed.  OPERATIVE COURSE:  After being identified preoperatively by myself,  the patient and  I agreed on the procedure and site of the procedure.  The surgical site was marked.  Surgical consent had been signed. She was given IV antibiotics as preoperative antibiotic prophylaxis. She was transferred to the operating room and placed on the operating table in supine position with the Left upper extremity on an arm board.  General anesthesia was induced by the anesthesiologist.  Left upper extremity was prepped and draped in normal sterile orthopedic fashion.  A surgical pause was performed between the surgeons, anesthesia, and operating room staff and all were in agreement as to the patient, procedure, and site of procedure.  Tourniquet at the proximal aspect of the extremity was inflated to 250 mmHg after exsanguination of the arm with an Esmarch bandage.  The wounds were explored.  There was gross contamination with grass clippings and what appeared to be hair.  This was removed with the pickups.  The wounds were all copiously irrigated with sterile saline by cystoscopy tubing.  The fingernails of the index and ring fingers were removed with a freer elevator.  The left index finger was addressed first.  The open fracture was debrided including excision of devitalized tissue including skin subcutaneous tissue and tissue deep to fascia.  A knife was used to remove these devitalized and contaminated tissues.  The skin was then repaired with a 5-0 Monocryl suture in an interrupted fashion.  The nailbed was reapproximated with 6-0 chromic suture in an interrupted fashion.  Good reapproximation of soft tissues was obtained.  The long finger was next addressed.  The knife was used to excise  the remaining fragments of distal phalanx bone.  The condyles of the middle phalanx were then rounded using a rongeured to provide better contour.  The remaining germinal matrix was removed with the knife.  The skin and subcutaneous tissues were sharply debrided with a knife to remove devitalized tissue and excess tissue.  The  5-0 Monocryl suture was used in interrupted fashion to reapproximate skin edges providing good coverage over the end of the bone.  In the ring finger the knife was used to remove small fragments of bone.  The rongeurs were used to contour the end of the distal phalanx.  Excess nailbed was removed with a knife.  The volar soft tissues were mobilized so that they could come over the end of the bone.  The 6-0 chromic suture was used to reapproximate the volar soft tissues to the dorsal nailbed.  The skin at the edges was reapproximated using a 5-0 Monocryl suture.  Good reapproximation of soft tissues was obtained.  There was a nailbed laceration to the ring finger which was also repaired with a 6-0 chromic suture.  Digital blocks were performed with quarter percent plain Marcaine to aid in postoperative analgesia.  Xeroform was placed in the nail folds and the wounds dressed with sterile Xeroform 4 x 4's and wrapped with a Kerlix bandage.  Volar splint was placed and wrapped with Kerlix and Ace bandage.  The tourniquet was deflated at 68 minutes.  Fingertips were pink with brisk capillary refill after deflation of tourniquet.  The operative  drapes were broken down.  The patient was awoken from anesthesia safely.  She was transferred back to the stretcher and taken to PACU in stable condition.  I will see her back in the office in 1 week for postoperative followup.  I will give her a prescription for Percocet 5/325 1-2 tabs PO q6 hours prn pain, dispense # 20 and Bactrim DS 1 p.o. twice daily x7 days.  Debridement type: Excisional Debridement  Side: left  Body Location: Index long and ring fingers  Tools used for debridement: scalpel and rongeur  Debridement depth beyond dead/damaged tissue down to healthy viable tissue: yes  Tissue layer involved: skin, subcutaneous tissue, muscle / fascia, bone  Nature of tissue removed: Devitalized Tissue  Irrigation volume: 3000 cc sterile saline     Irrigation  fluid type: Normal Saline   Betha Loa, MD Electronically signed, 09/21/20

## 2020-09-21 NOTE — Progress Notes (Signed)
Pt has arrived to 2 [address] 16 from PACU Identified appropriately. Pt is alert and oriented x 4, VS stable no signs of acute distress. Denied chest pain and SOB.  Pt placed on progressive monitor and CCMD notified, Bipap in place and pt tolerating it well. Oriented to room nd equipment, instructed to use call bell for assistance and call bell left within pt reach. Admission notified of pt arrival to the floor. Will continue to monitor and treat pt per MD orders.

## 2020-09-21 NOTE — Evaluation (Signed)
Physical Therapy Evaluation & Discharge Patient Details Name: Julie Huffman MRN: [id number] DOB: 07/11/1965 Today's Date: 09/21/2020   History of Present Illness  Pt is a 55 y.o. female admitted 09/20/20 with L hand injury from lawn mower; pt sustained multiple finger fxs and amputation of distal long finger. S/p L index finger I&D, L long finger revision amputation, L ring finger revision amputation 4/29. PMH includes HTN, PNA, asthma, anxiety.    Clinical Impression  Patient evaluated by Physical Therapy with no further acute PT needs identified. PTA, pt independent, teaches at [employer], active with yardwork. Today, pt moving well at supervision-level; fortunately pt is R-hand dominant. Educ on LUE precautions, including NWB and edema control. All education has been completed and the patient has no further questions. Acute PT is signing off. Thank you for this referral.  Pt received on 3L O2 [place]; SpO2 90-92% on RA with activity    Follow Up Recommendations No PT follow up;Supervision - Intermittent    Equipment Recommendations  None recommended by PT    Recommendations for Other Services       Precautions / Restrictions Precautions Precautions: Fall Restrictions Weight Bearing Restrictions: Yes LUE Weight Bearing: Non weight bearing Other Position/Activity Restrictions: No official orders - assumed L hand NWB      Mobility  Bed Mobility Overal bed mobility: Modified Independent             General bed mobility comments: HOB elevated, cues to keep weight off LUE    Transfers Overall transfer level: Needs assistance Equipment used: None Transfers: Sit to/from Stand           General transfer comment: Pt reaching for RUE support to pull into standing, min guard; able to perform multiple sit<>stands from recliner indep without UE support  Ambulation/Gait Ambulation/Gait assistance: Supervision Gait Distance (Feet): 40 Feet Assistive device: None;IV Pole Gait  Pattern/deviations: Step-through pattern;Decreased stride length Gait velocity: Decreased   General Gait Details: Slow, steady gait in room with and without pushing IV pole; no overt instability or LOB; supervision secondary to first time up today  Stairs            Wheelchair Mobility    Modified Rankin (Stroke Patients Only)       Balance Overall balance assessment: No apparent balance deficits (not formally assessed)                                           Pertinent Vitals/Pain Pain Assessment: Faces Faces Pain Scale: Hurts little more Pain Location: L hand Pain Descriptors / Indicators: Discomfort;Guarding Pain Intervention(s): Premedicated before session;Repositioned    Home Living Family/patient expects to be discharged to:: Private residence Living Arrangements: Spouse/significant other Available Help at Discharge: Family;Available 24 hours/day Type of Home: House Home Access: Stairs to enter Entrance Stairs-Rails: None Entrance Stairs-Number of Steps: 3-4 Home Layout: One level Home Equipment: None      Prior Function Level of Independence: Independent         Comments: Works Agricultural consultant in Civil engineer, 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[employer], Publishing rights manager with Paediatric nurse        Extremity/Trunk Assessment   Upper Extremity Assessment Upper Extremity Assessment: LUE deficits/detail LUE Deficits / Details: Shoulder and elbow WFL; hand wrapped, NT LUE: Unable to fully assess due to immobilization LUE Coordination: decreased fine motor    Lower Extremity  Assessment Lower Extremity Assessment: Overall WFL for tasks assessed       Communication   Communication: No difficulties  Cognition Arousal/Alertness: Awake/alert Behavior During Therapy: WFL for tasks assessed/performed Overall Cognitive Status: Within Functional Limits for tasks assessed                                 General Comments: Some initial lethargy (pt  reports recently receiving anxiety and pain meds) - much improved alertness with mobility      General Comments General comments (skin integrity, edema, etc.): Educ on strategies to perform ADLs with single UE support; educ on L hand precautions, including NWB, elevation, edema control    Exercises     Assessment/Plan    PT Assessment Patent does not need any further PT services  PT Problem List         PT Treatment Interventions      PT Goals (Current goals can be found in the Care Plan section)  Acute Rehab PT Goals PT Goal Formulation: All assessment and education complete, DC therapy    Frequency     Barriers to discharge        Co-evaluation               AM-PAC PT "6 Clicks" Mobility  Outcome Measure Help needed turning from your back to your side while in a flat bed without using bedrails?: None Help needed moving from lying on your back to sitting on the side of a flat bed without using bedrails?: None Help needed moving to and from a bed to a chair (including a wheelchair)?: None Help needed standing up from a chair using your arms (e.g., wheelchair or bedside chair)?: None Help needed to walk in hospital room?: A Little Help needed climbing 3-5 steps with a railing? : A Little 6 Click Score: 22    End of Session   Activity Tolerance: Patient tolerated treatment well Patient left: in chair;with call bell/phone within reach;Other (comment) (with pharmacy) Nurse Communication: Mobility status PT Visit Diagnosis: Other abnormalities of gait and mobility (R26.89);Pain Pain - Right/Left: Left Pain - part of body: Hand    Time: 0240-9735 PT Time Calculation (min) (ACUTE ONLY): 16 min   Charges:   PT Evaluation $PT Eval Low Complexity: 1 Low         Ina Homes, PT, DPT Acute Rehabilitation Services  Pager [phone] Office [phone]  Malachy Chamber 09/21/2020, 9:49 AM

## 2020-09-21 NOTE — Anesthesia Procedure Notes (Signed)
Procedure Name: Intubation Date/Time: 09/21/2020 12:23 AM Performed by: Claudina Lick, CRNA Pre-anesthesia Checklist: Patient identified, Emergency Drugs available, Suction available, Patient being monitored and Timeout performed Patient Re-evaluated:Patient Re-evaluated prior to induction Oxygen Delivery Method: Circle system utilized Preoxygenation: Pre-oxygenation with 100% oxygen Induction Type: IV induction, Rapid sequence and Cricoid Pressure applied Laryngoscope Size: Miller and 2 Grade View: Grade I Tube type: Oral Tube size: 7.5 mm Number of attempts: 1 Airway Equipment and Method: Stylet Placement Confirmation: ETT inserted through vocal cords under direct vision,  positive ETCO2 and breath sounds checked- equal and bilateral Secured at: 21 cm Tube secured with: Tape Dental Injury: Teeth and Oropharynx as per pre-operative assessment

## 2020-09-21 NOTE — Discharge Instructions (Addendum)
[facility] Center Instructions Hand Surgery  Wound Care: Keep your hand elevated above the level of your heart.  Do not allow it to dangle by your side.  Keep the dressing dry and do not remove it unless your doctor advises you to do so.  He will usually change it at the time of your post-op visit.  Moving your fingers is advised to stimulate circulation but will depend on the site of your surgery.  If you have a splint applied, your doctor will advise you regarding movement.  Activity: Do not drive or operate machinery today.  Rest today and then you may return to your normal activity and work as indicated by your physician.  Diet:  Drink liquids today or eat a light diet.  You may resume a regular diet tomorrow.    General expectations: Pain for two to three days. Fingers may become slightly swollen.  Call your doctor if any of the following occur: Severe pain not relieved by pain medication. Elevated temperature. Dressing soaked with blood. Inability to move fingers. White or bluish color to fingers.   Follow with Primary MD Julie Huffman., PA-C in 7 days   Get CBC, CMP, 2 view Chest X ray -  checked next visit within 1 week by Primary MD  Activity: As tolerated with Full fall precautions use walker/cane & assistance as needed  Disposition Home   Diet: Heart Healthy  Low Carb  Special Instructions: If you have smoked or chewed Tobacco  in the last 2 yrs please stop smoking, stop any regular Alcohol  and or any Recreational drug use.  On your next visit with your primary care physician please Get Medicines reviewed and adjusted.  Please request your Prim.MD to go over all Hospital Tests and Procedure/Radiological results at the follow up, please get all Hospital records sent to your Prim MD by signing hospital release before you go home.  If you experience worsening of your admission symptoms, develop shortness of breath, life threatening emergency, suicidal or homicidal  thoughts you must seek medical attention immediately by calling 911 or calling your MD immediately  if symptoms less severe.  You Must read complete instructions/literature along with all the possible adverse reactions/side effects for all the Medicines you take and that have been prescribed to you. Take any new Medicines after you have completely understood and accpet all the possible adverse reactions/side effects.

## 2020-09-21 NOTE — Transfer of Care (Signed)
Immediate Anesthesia Transfer of Care Note  Patient: Nadara Mode  Procedure(s) Performed: REVISION AMPUTATION OF LEFT , LONG, RING FINGERS  REPAIR SKIN AND NAIL BED LEFT INDEX FINGER (Left Finger) IRRIGATION AND DEBRIDEMENT LEFT INDEX, LONG, RING FINGERS (Left Finger)  Patient Location: PACU  Anesthesia Type:General  Level of Consciousness: drowsy  Airway & Oxygen Therapy: Patient Spontanous Breathing and Patient connected to face mask oxygen  Post-op Assessment: Report given to RN and Post -op Vital signs reviewed and stable  Post vital signs: Reviewed and stable  Last Vitals:  Vitals Value Taken Time  BP 169/79 09/21/20 0220  Temp    Pulse 90 09/21/20 0222  Resp 14 09/21/20 0222  SpO2 100 % 09/21/20 0222  Vitals shown include unvalidated device data.  Last Pain:  Vitals:   09/20/20 2323  TempSrc: Oral  PainSc:          Complications: No complications documented.

## 2020-09-21 NOTE — Progress Notes (Signed)
RT NOTE: Patient taken off bipap and placed on 3L [place] at this time. Vitals are stable and sats are 97%. Patient is alert and oriented. No increased WOB or distress noted. RT will continue to monitor.

## 2020-09-21 NOTE — H&P (Addendum)
History and Physical    Julie Huffman YQM:[id number] DOB: 1965/12/15 DOA: 09/20/2020  PCP: Richmond Campbell., PA-C  Patient coming from: Home   Chief Complaint:  Chief Complaint  Patient presents with  . Hand Injury     HPI:    55 year old female with past medical history of anxiety disorder, asthma, gastroesophageal reflux disease, obstructive sleep apnea who presents to [facility] emergency department after injuring her left hand with her lawnmower.  Patient explains that for the past 3 days he has been experiencing mild shortness of breath and cough productive of yellow sputum.  Patient attributes this to increased pollen in the air over the past several weeks.  Cough and mild shortness of breath of been associated with increasing amounts of wheezing.  Patient denies any associated fevers, travel or recent contact with those with recent COVID-19 infection.  Of note, patient's husband was recently ill and had to be hospitalized at [facility] late last week for COPD exacerbation.  Patient symptoms of cough and wheezing continue to worsen and on 4/28 she decided to mow her lawn.  Patient states that she had to be placed back attached to her lawnmower but decided to stick her hand into the side of the running lawnmower to clear out some impacted grass prior to replacing the bag.  While attempting to do this the blades injured her hand, suffering 2 of her fingers and had a lacerating another.  Patient then presented to [facility] emergency department for evaluation.  Upon evaluation in the emergency department, Dr. Merlyn Lot with orthopedic hand surgery promptly took the patient to the operating room where he debrided the left index finger area.  The nailbed laceration of the finger as well.  He then revised the amputations of the long finger and ring finger.  Immediately postoperatively in the PACU patient exhibited respiratory distress prompting Dr. Merlyn Lot to call the  hospitalist group to assess the patient for hospitalization on the medical service.  Review of Systems:   Review of Systems  Respiratory: Positive for cough, sputum production, shortness of breath and wheezing.   Musculoskeletal: Positive for joint pain.    Past Medical History:  Diagnosis Date  . Acute serous otitis media   . Acute upper respiratory infections of unspecified site   . Anxiety disorder   . Asthma   . Benign paroxysmal positional vertigo   . Candidiasis of vulva and vagina   . Dizziness   . Fatigue   . Headache(784.0)   . Hypertension   . Left arm numbness   . OSA (obstructive sleep apnea) 04/07/2012   NPSG 2013:  AHI 45/hr with desat to 61%   . Pneumonia, organism unspecified(486)   . Sleep apnea     Past Surgical History:  Procedure Laterality Date  . TUBAL LIGATION       reports that she has quit smoking. Her smoking use included cigarettes. She has a 15.00 pack-year smoking history. She has never used smokeless tobacco. She reports that she does not drink alcohol. No history on file for drug use.  No Known Allergies  History reviewed. No pertinent family history.   Prior to Admission medications   Medication Sig Start Date End Date Taking? Authorizing Provider  HYDROcodone-acetaminophen (NORCO) 5-325 MG tablet 1-2 tabs po q6 hours prn pain 09/21/20  Yes Betha Loa, MD  sulfamethoxazole-trimethoprim (BACTRIM DS) 800-160 MG tablet Take 1 tablet by mouth 2 (two) times daily. 09/21/20  Yes Betha Loa, MD  albuterol (PROVENTIL HFA;VENTOLIN HFA) 108 (90 BASE) MCG/ACT inhaler Inhale 2 puffs into the lungs every 6 (six) hours as needed.    [provider]  ALPRAZolam Prudy Feeler) 1 MG tablet Take 1 mg by mouth 2 (two) times daily as needed.  02/16/12   [provider]  carisoprodol (SOMA) 250 MG tablet Take 250 mg by mouth daily.  02/16/12   [provider]  cefdinir (OMNICEF) 300 MG capsule  02/17/12   [provider]   Cholecalciferol (VITAMIN D-3) 1000 UNITS CAPS Take 1 Can by mouth as directed.    [provider]  cyanocobalamin 1000 MCG tablet Take 100 mcg by mouth daily.    [provider]  escitalopram (LEXAPRO) 10 MG tablet Take 10 mg by mouth daily.  02/14/12   [provider]  Fluticasone-Salmeterol (ADVAIR) 250-50 MCG/DOSE AEPB Inhale 1 puff into the lungs every 12 (twelve) hours.    [provider]  MIMVEY 1-0.5 MG per tablet  02/14/12   [provider]  Multiple Vitamins-Minerals (MULTIVITAMINS THER. W/MINERALS) TABS Take 1 tablet by mouth daily.    [provider]  NON FORMULARY Spectra vite  Takes 1 a day .    [provider]  Omega-3 Fatty Acids (FISH OIL PO) Take 1 Can by mouth daily.    [provider]  oxyCODONE-acetaminophen (PERCOCET) 7.5-325 MG per tablet Take 1 tablet by mouth every 6 (six) hours as needed.  02/02/12   [provider]    Physical Exam: Vitals:   09/21/20 0345 09/21/20 0400 09/21/20 0455 09/21/20 0503  BP: 112/67 124/62  125/71  Pulse: 78 78 82 79  Resp: 17 16 18 16   Temp:  98 F (36.7 C)  97.6 F (36.4 C)  TempSrc:    Oral  SpO2: 98% 99% 97% 100%  Weight:      Height:        Constitutional: Awake alert and oriented x3, patient is in mild respiratory distress.  Patient is anxious.   Skin: no rashes, no lesions, good skin turgor noted. Eyes: Pupils are equally reactive to light.  No evidence of scleral icterus or conjunctival pallor.  ENMT: Moist mucous membranes noted.  Posterior pharynx clear of any exudate or lesions.   Neck: normal, supple, no masses, no thyromegaly.  No evidence of jugular venous distension.   Respiratory: Somewhat coarse breath sounds with expiratory wheezing heard in all fields, worse in the bilateral lower fields, left greater than right.  No significant rales noted.  Normal respiratory effort. No accessory muscle use.  Cardiovascular: Regular rate and rhythm,  no murmurs / rubs / gallops. No extremity edema. 2+ pedal pulses. No carotid bruits.  Chest:   Nontender without crepitus or deformity.   Back:   Nontender without crepitus or deformity. Abdomen: Abdomen is soft and nontender.  No evidence of intra-abdominal masses.  Positive bowel sounds noted in all quadrants.   Musculoskeletal: Left hand is wrapped with dressing clean dry and intact.  Otherwise, no other evidence of deformities of the other extremities. Good ROM, no contractures. Normal muscle tone.  Neurologic: CN 2-12 grossly intact. Sensation intact.  Patient moving all 4 extremities spontaneously.  Patient is following all commands.  Patient is responsive to verbal stimuli.   Psychiatric: Patient is quite anxious with labile affect.  Patient is very tearful concerning the loss of the digits of her left hand.  Patient seems to possess insight as to their current situation.     Labs  on Admission: I have personally reviewed following labs and imaging studies -   CBC: Recent Labs  Lab 09/20/20 2024  WBC 8.1  HGB 13.7  HCT 42.7  MCV 92.8  PLT 255   Basic Metabolic Panel: Recent Labs  Lab 09/20/20 2024  NA 139  K 3.0*  CL 100  CO2 30  GLUCOSE 122*  BUN 11  CREATININE 0.76  CALCIUM 8.9   GFR: Estimated Creatinine Clearance: 78.3 mL/min (by C-G formula based on SCr of 0.76 mg/dL). Liver Function Tests: No results for input(s): AST, ALT, ALKPHOS, BILITOT, PROT, ALBUMIN in the last 168 hours. No results for input(s): LIPASE, AMYLASE in the last 168 hours. No results for input(s): AMMONIA in the last 168 hours. Coagulation Profile: No results for input(s): INR, PROTIME in the last 168 hours. Cardiac Enzymes: No results for input(s): CKTOTAL, CKMB, CKMBINDEX, TROPONINI in the last 168 hours. BNP (last 3 results) No results for input(s): PROBNP in the last 8760 hours. HbA1C: No results for input(s): HGBA1C in the last 72 hours. CBG: No results for input(s): GLUCAP in the  last 168 hours. Lipid Profile: No results for input(s): CHOL, HDL, LDLCALC, TRIG, CHOLHDL, LDLDIRECT in the last 72 hours. Thyroid Function Tests: No results for input(s): TSH, T4TOTAL, FREET4, T3FREE, THYROIDAB in the last 72 hours. Anemia Panel: No results for input(s): VITAMINB12, FOLATE, FERRITIN, TIBC, IRON, RETICCTPCT in the last 72 hours. Urine analysis: No results found for: COLORURINE, APPEARANCEUR, LABSPEC, PHURINE, GLUCOSEU, HGBUR, BILIRUBINUR, KETONESUR, PROTEINUR, UROBILINOGEN, NITRITE, LEUKOCYTESUR  Radiological Exams on Admission - Personally Reviewed: DG Chest 1 View  Result Date: 09/21/2020 CLINICAL DATA:  Dyspnea EXAM: CHEST  1 VIEW COMPARISON:  None. FINDINGS: Lung volumes are small but are symmetric. Left perihilar asymmetric interstitial pulmonary infiltrate is present, likely infectious in the acute setting. No pneumothorax or pleural effusion. Cardiac size within normal limits. No acute bone abnormality. IMPRESSION: Left perihilar focal pulmonary infiltrate, likely infectious in the acute setting. Electronically Signed   By: Helyn Numbers MD   On: 09/21/2020 03:07   CT CHEST W CONTRAST  Result Date: 09/21/2020 CLINICAL DATA:  55 year old female with shortness of breath. Abnormal left perihilar density on x-ray. EXAM: CT CHEST WITH CONTRAST TECHNIQUE: Multidetector CT imaging of the chest was performed during intravenous contrast administration. CONTRAST:  25mL OMNIPAQUE IOHEXOL 300 MG/ML  SOLN COMPARISON:  Portable chest 0301 hours today. FINDINGS: Cardiovascular: No cardiomegaly or pericardial effusion. Negative thoracic aorta, minimal atherosclerosis. Main pulmonary arteries appear patent. No calcified coronary artery atherosclerosis is evident. Mediastinum/Nodes: Negative.  No lymphadenopathy. Lungs/Pleura: Major airways are patent. There is segmental consolidation in the left lower lobe superior and posterior basal segments with air bronchograms. Similar but lesser  streaky opacity in the right lower lobe. Additional nonspecific and subtle left upper lobe ground-glass and streaky peribronchial opacity (series 4, images 32 and 44). Right upper lobe, right middle lobe and lingula appear spared. No pleural effusion. Upper Abdomen: Negative visible liver, gallbladder, spleen, pancreas, adrenal glands, and bowel in the upper abdomen. Negative visible left kidney. Mild right renal midpole scarring suspected. Musculoskeletal: Thoracic disc degeneration. Lower thoracic vacuum disc. No acute or suspicious osseous lesion identified. Other findings: Ptosis of the left submandibular gland suspected and visible at the left thoracic inlet on series 3 image 9. Contralateral right submandibular gland suspected on image 3. No definite thoracic inlet lymphadenopathy. No axillary lymphadenopathy. IMPRESSION: 1. Segmental consolidation in the left lower lobe most compatible with pneumonia. Additional nonspecific left upper lobe ground-glass  and streaky peribronchial opacity may be early infectious involvement. No pleural effusion. 2. Mild pulmonary atelectasis, scarring otherwise. 3. Mediastinum and upper abdomen appear negative. Electronically Signed   By: Odessa FlemingH  Hall M.D.   On: 09/21/2020 05:06   DG Hand 2 View Left  Result Date: 09/20/2020 CLINICAL DATA:  [age] year old female with injury to the left hand with lawnmower. EXAM: LEFT HAND - 2 VIEW COMPARISON:  None. FINDINGS: There is fracture of the base of the distal phalanx of the third digit with amputation of the distal phalanx. There are displaced fractures of the distal tufts of the distal phalanges of the first and fourth digits. There is laceration of the soft tissues of the distal digits and amputation of the distal third digit. There is mild osteopenia. Mild degenerative changes of the base of the thumb. No dislocation. No radiopaque foreign object. IMPRESSION: Fractures of the distal phalanges of the first, third, and fourth digits as  above. Electronically Signed   By: Elgie CollardArash  Radparvar M.D.   On: 09/20/2020 21:01    Telemetry: Personally reviewed.  Rhythm is normal sinus rhythm with heart rate of 90 bpm.  No dynamic ST segment changes appreciated.  Assessment/Plan Principal Problem:   Acute respiratory failure with hypercapnia (HCC) secondary to acute asthma exacerbation and left lower lobe pneumonia   Patient exhibiting substantial respiratory distress in the PACU shortly after getting out of the operating room  This is evidenced by ABG revealing pH of 7.28 with PaCO2 of 66.7  Based on the CBG patient has been placed on BiPAP therapy temporarily with substantial improvement in respiratory status  Initial evaluation including imaging has revealed a left lower lobe pneumonia which is most likely what prompted the patient's several day history of shortness of breath cough and wheezing.  Furthermore, patient is likely suffering from a superimposed asthma exacerbation.  Treating patient with intravenous antibiotic therapy for pneumonia with concurrent systemic steroids and aggressive bronchodilator therapy for concurrent asthma exacerbation  We will wean BiPAP as tolerated.  Repeat ABG to be performed at 7 AM.  Active Problems:    Hypokalemia   Replacing with intravenous potassium chloride    Generalized anxiety disorder   Continuing home regimen of as needed benzodiazepines  Patient is particularly anxious and upset concerning the injury to her left hand.    GERD without esophagitis   Continue daily PPI   Code Status:  Full code Family Communication: deferred   Status is: Observation  The patient remains OBS appropriate and will d/c before 2 midnights.  Dispo: The patient is from: Home              Anticipated d/c is to: Home              Patient currently is not medically stable to d/c.   Difficult to place patient No        Marinda ElkGeorge J Ngan Qualls MD [facility] Pager [phone]-  [phone]  If 7PM-7AM, please contact night-coverage [email or web address] Use universal [facility] password for that web site. If you do not have the password, please call the hospital operator.  09/21/2020, 6:02 AM

## 2020-09-22 DIAGNOSIS — J9602 Acute respiratory failure with hypercapnia: Secondary | ICD-10-CM | POA: Diagnosis not present

## 2020-09-22 LAB — CBC WITH DIFFERENTIAL/PLATELET
Abs Immature Granulocytes: 0.07 10*3/uL (ref 0.00–0.07)
Basophils Absolute: 0 10*3/uL (ref 0.0–0.1)
Basophils Relative: 0 %
Eosinophils Absolute: 0 10*3/uL (ref 0.0–0.5)
Eosinophils Relative: 0 %
HCT: 37.9 % (ref 36.0–46.0)
Hemoglobin: 12.1 g/dL (ref 12.0–15.0)
Immature Granulocytes: 1 %
Lymphocytes Relative: 6 %
Lymphs Abs: 0.7 10*3/uL (ref 0.7–4.0)
MCH: 29.6 pg (ref 26.0–34.0)
MCHC: 31.9 g/dL (ref 30.0–36.0)
MCV: 92.7 fL (ref 80.0–100.0)
Monocytes Absolute: 0.2 10*3/uL (ref 0.1–1.0)
Monocytes Relative: 2 %
Neutro Abs: 10.2 10*3/uL — ABNORMAL HIGH (ref 1.7–7.7)
Neutrophils Relative %: 91 %
Platelets: 214 10*3/uL (ref 150–400)
RBC: 4.09 MIL/uL (ref 3.87–5.11)
RDW: 13 % (ref 11.5–15.5)
WBC: 11.2 10*3/uL — ABNORMAL HIGH (ref 4.0–10.5)
nRBC: 0 % (ref 0.0–0.2)

## 2020-09-22 LAB — COMPREHENSIVE METABOLIC PANEL
ALT: 22 U/L (ref 0–44)
AST: 48 U/L — ABNORMAL HIGH (ref 15–41)
Albumin: 3 g/dL — ABNORMAL LOW (ref 3.5–5.0)
Alkaline Phosphatase: 64 U/L (ref 38–126)
Anion gap: 9 (ref 5–15)
BUN: 11 mg/dL (ref 6–20)
CO2: 29 mmol/L (ref 22–32)
Calcium: 9.1 mg/dL (ref 8.9–10.3)
Chloride: 101 mmol/L (ref 98–111)
Creatinine, Ser: 0.78 mg/dL (ref 0.44–1.00)
GFR, Estimated: >60 mL/min (ref >60)
Glucose, Bld: 255 mg/dL — ABNORMAL HIGH (ref 70–99)
Potassium: 4.4 mmol/L (ref 3.5–5.1)
Sodium: 139 mmol/L (ref 135–145)
Total Bilirubin: 0.6 mg/dL (ref 0.3–1.2)
Total Protein: 5.7 g/dL — ABNORMAL LOW (ref 6.5–8.1)

## 2020-09-22 LAB — BRAIN NATRIURETIC PEPTIDE: B Natriuretic Peptide: 291.4 pg/mL — ABNORMAL HIGH (ref 0.0–100.0)

## 2020-09-22 LAB — PROCALCITONIN: Procalcitonin: <0.1 ng/mL

## 2020-09-22 LAB — MAGNESIUM: Magnesium: 2.2 mg/dL (ref 1.7–2.4)

## 2020-09-22 MED ORDER — METHYLPREDNISOLONE 4 MG PO TBPK: ORAL_TABLET | ORAL | 0 refills | Status: AC

## 2020-09-22 MED ORDER — IBUPROFEN 400 MG PO TABS: 400.0000 mg | ORAL_TABLET | Freq: Three times a day (TID) | ORAL | 0 refills | Status: AC | PRN

## 2020-09-22 MED ORDER — AZITHROMYCIN 500 MG PO TABS: 500.0000 mg | ORAL_TABLET | Freq: Every day | ORAL | 0 refills | Status: AC

## 2020-09-22 NOTE — Discharge Summary (Signed)
Julie Huffman EQF:[id number] DOB: 1965-11-24 DOA: 09/20/2020  PCP: Richmond Campbell., PA-C  Admit date: 09/20/2020  Discharge date: 09/22/2020  Admitted From: Home   Disposition:  Home   Recommendations for Outpatient Follow-up:   Follow up with PCP in 1-2 weeks  PCP Please obtain BMP/CBC, 2 view CXR in 1week,  (see Discharge instructions)   PCP Please follow up on the following pending results: compliance with CPAP   Home Health: None   Equipment/Devices: None  Consultations: Hand Surgery Discharge Condition: Stable    CODE STATUS: Full    Diet Recommendation: Heart Healthy   Diet Order            Diet regular Room service appropriate? Yes; Fluid consistency: Thin  Diet effective now                  Chief Complaint  Patient presents with  . Hand Injury     Brief history of present illness from the day of admission and additional interim summary    55 year old female with past medical history of anxiety disorder, asthma, gastroesophageal reflux disease, obstructive sleep apnea who presents to [facility] emergency department after injuring her left hand withherlawnmower, she was seen by hand surgeon Dr. 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                                                                 Hospital Course   1. 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 Merlyn Lot.  Keep the  site clean and dry follow-up with Dr. Merlyn Lot within a week of discharge.  2.  Metabolic encephalopathy caused by acute hypercapnic respiratory failure due to combination of sedation, underlying OSA and obesity.  She was placed on BiPAP at night, mental status has improved and back to baseline, currently stable on room air and symptomatically.  Has been counseled overnight stay CPAP compliance at home, minimize narcotic use.  This problem seems to have resolved  3.  Left lower lobe infiltrate along with some wheezing prior to hospital admission.  Likely community-acquired pneumonia along with worsening of asthma due to seasonal allergies.  He was placed on steroids and antibiotics shortness of breath at this time, will be discharged on Bactrim for hand infection along with azithromycin for atypical infection, Medrol Dosepak over the next few days and follow-up with PCP.  4.  Generalized  anxiety disorder.    Continue home regimen and follow with PCP palpitations.  5.  GERD.  PPI.    Discharge diagnosis     Principal Problem:   Acute respiratory failure with hypercapnia (HCC) Active Problems:   Acute asthma exacerbation   Pneumonia of left lower lobe due to infectious organism   Generalized anxiety disorder   GERD without esophagitis   Hypokalemia   Acute respiratory failure with hypoxia and hypercapnia [facility])    Discharge instructions    Discharge Instructions    Discharge instructions   Complete by: As directed    Hand Center Instructions Hand Surgery  Wound Care: Keep your hand elevated above the level of your heart.  Do not allow it to dangle by your side.  Keep the dressing dry and do not remove it unless your doctor advises you to do so.  He will usually change it at the time of your post-op visit.  Moving your fingers is advised to stimulate circulation but will depend on the site of your surgery.  If you have a splint applied, your doctor will advise you regarding  movement.  Activity: Do not drive or operate machinery today.  Rest today and then you may return to your normal activity and work as indicated by your physician.  Diet:  Drink liquids today or eat a light diet.  You may resume a regular diet tomorrow.    General expectations: Pain for two to three days. Fingers may become slightly swollen.  Call your doctor if any of the following occur: Severe pain not relieved by pain medication. Elevated temperature. Dressing soaked with blood. Inability to move fingers. White or bluish color to fingers.   Follow with Primary MD Richmond Campbell., PA-C in 7 days   Get CBC, CMP, 2 view Chest X ray -  checked next visit within 1 week by Primary MD  Activity: As tolerated with Full fall precautions use walker/cane & assistance as needed  Disposition Home   Diet: Heart Healthy  Low Carb  Special Instructions: If you have smoked or chewed Tobacco  in the last 2 yrs please stop smoking, stop any regular Alcohol  and or any Recreational drug use.  On your next visit with your primary care physician please Get Medicines reviewed and adjusted.  Please request your Prim.MD to go over all Hospital Tests and Procedure/Radiological results at the follow up, please get all Hospital records sent to your Prim MD by signing hospital release before you go home.  If you experience worsening of your admission symptoms, develop shortness of breath, life threatening emergency, suicidal or homicidal thoughts you must seek medical attention immediately by calling 911 or calling your MD immediately  if symptoms less severe.  You Must read complete instructions/literature along with all the possible adverse reactions/side effects for all the Medicines you take and that have been prescribed to you. Take any new Medicines after you have completely understood and accpet all the possible adverse reactions/side effects.   Discharge wound care:   Complete by: As  directed    Follow instructions given by the surgeon, keep L hand clean and dry at all times.      Discharge Medications   Allergies as of 09/22/2020   No Known Allergies     Medication List    STOP taking these medications   oxyCODONE-acetaminophen 7.5-325 MG tablet Commonly known as: PERCOCET     TAKE these medications   albuterol 108 (90 Base) MCG/ACT  inhaler Commonly known as: VENTOLIN HFA Inhale 2 puffs into the lungs every 6 (six) hours as needed for wheezing or shortness of breath.   ALPRAZolam 1 MG tablet Commonly known as: XANAX Take 1 mg by mouth 2 (two) times daily as needed for anxiety.   azithromycin 500 MG tablet Commonly known as: Zithromax Take 1 tablet (500 mg total) by mouth daily.   cyanocobalamin 1000 MCG tablet Take 1,000 mcg by mouth daily.   dexlansoprazole 60 MG capsule Commonly known as: DEXILANT Take 60 mg by mouth daily.   escitalopram 20 MG tablet Commonly known as: LEXAPRO Take 20 mg by mouth daily.   FISH OIL PO Take 1,000 mg by mouth daily.   Fluticasone-Salmeterol 250-50 MCG/DOSE Aepb Commonly known as: ADVAIR Inhale 1 puff into the lungs every 12 (twelve) hours as needed (shortness of breath).   HYDROcodone-acetaminophen 5-325 MG tablet Commonly known as: Norco 1-2 tabs po q6 hours prn pain   ibuprofen 400 MG tablet Commonly known as: ADVIL Take 1 tablet (400 mg total) by mouth every 8 (eight) hours as needed for moderate pain.   methylPREDNISolone 4 MG Tbpk tablet Commonly known as: MEDROL DOSEPAK follow package directions   Mimvey 1-0.5 MG tablet Generic drug: estradiol-norethindrone Take 1 tablet by mouth daily.   multivitamins ther. w/minerals Tabs tablet Take 1 tablet by mouth daily.   Pseudoephedrine-Guaifenesin [phone] MG Tb12 Take 1 tablet by mouth every 12 (twelve) hours as needed (mucus).   rizatriptan 10 MG tablet Commonly known as: MAXALT Take 10 mg by mouth 2 (two) times daily as needed for  migraine.   sulfamethoxazole-trimethoprim 800-160 MG tablet Commonly known as: Bactrim DS Take 1 tablet by mouth 2 (two) times daily.   vitamin C 500 MG tablet Commonly known as: ASCORBIC ACID Take 1,000 mg by mouth daily.   Vitamin D-3 25 MCG (1000 UT) Caps Take 1,000 Units by mouth daily.   Vyvanse 70 MG capsule Generic drug: lisdexamfetamine Take 70 mg by mouth every morning.   zinc gluconate 50 MG tablet Take 50 mg by mouth daily.            Discharge Care Instructions  (From admission, onward)         Start     Ordered   09/22/20 0000  Discharge wound care:       Comments: Follow instructions given by the surgeon, keep L hand clean and dry at all times.   09/22/20 1610           Follow-up Information    Betha Loa, MD In 1 week.   Specialty: Orthopedic Surgery Contact information: [address] [place] [address] [phone]        Richmond Campbell., PA-C. Schedule an appointment as soon as possible for a visit in 1 week(s).   Specialty: Family Medicine Contact information: [address] [place] [address] [phone]               Major procedures and Radiology Reports - PLEASE review detailed and final reports thoroughly  -       DG Chest 1 View  Result Date: 09/21/2020 CLINICAL DATA:  Dyspnea EXAM: CHEST  1 VIEW COMPARISON:  None. FINDINGS: Lung volumes are small but are symmetric. Left perihilar asymmetric interstitial pulmonary infiltrate is present, likely infectious in the acute setting. No pneumothorax or pleural effusion. Cardiac size within normal limits. No acute bone abnormality. IMPRESSION: Left perihilar focal pulmonary infiltrate, likely infectious in the acute setting.  Electronically Signed   By: Helyn Numbers MD   On: 09/21/2020 03:07   CT CHEST W CONTRAST  Result Date: 09/21/2020 CLINICAL DATA:  55 year old female with shortness of breath. Abnormal left perihilar density on x-ray. EXAM: CT CHEST WITH  CONTRAST TECHNIQUE: Multidetector CT imaging of the chest was performed during intravenous contrast administration. CONTRAST:  80mL OMNIPAQUE IOHEXOL 300 MG/ML  SOLN COMPARISON:  Portable chest 0301 hours today. FINDINGS: Cardiovascular: No cardiomegaly or pericardial effusion. Negative thoracic aorta, minimal atherosclerosis. Main pulmonary arteries appear patent. No calcified coronary artery atherosclerosis is evident. Mediastinum/Nodes: Negative.  No lymphadenopathy. Lungs/Pleura: Major airways are patent. There is segmental consolidation in the left lower lobe superior and posterior basal segments with air bronchograms. Similar but lesser streaky opacity in the right lower lobe. Additional nonspecific and subtle left upper lobe ground-glass and streaky peribronchial opacity (series 4, images 32 and 44). Right upper lobe, right middle lobe and lingula appear spared. No pleural effusion. Upper Abdomen: Negative visible liver, gallbladder, spleen, pancreas, adrenal glands, and bowel in the upper abdomen. Negative visible left kidney. Mild right renal midpole scarring suspected. Musculoskeletal: Thoracic disc degeneration. Lower thoracic vacuum disc. No acute or suspicious osseous lesion identified. Other findings: Ptosis of the left submandibular gland suspected and visible at the left thoracic inlet on series 3 image 9. Contralateral right submandibular gland suspected on image 3. No definite thoracic inlet lymphadenopathy. No axillary lymphadenopathy. IMPRESSION: 1. Segmental consolidation in the left lower lobe most compatible with pneumonia. Additional nonspecific left upper lobe ground-glass and streaky peribronchial opacity may be early infectious involvement. No pleural effusion. 2. Mild pulmonary atelectasis, scarring otherwise. 3. Mediastinum and upper abdomen appear negative. Electronically Signed   By: Odessa Fleming M.D.   On: 09/21/2020 05:06   DG Hand 2 View Left  Result Date: 09/20/2020 CLINICAL DATA:   55 year old female with injury to the left hand with lawnmower. EXAM: LEFT HAND - 2 VIEW COMPARISON:  None. FINDINGS: There is fracture of the base of the distal phalanx of the third digit with amputation of the distal phalanx. There are displaced fractures of the distal tufts of the distal phalanges of the first and fourth digits. There is laceration of the soft tissues of the distal digits and amputation of the distal third digit. There is mild osteopenia. Mild degenerative changes of the base of the thumb. No dislocation. No radiopaque foreign object. IMPRESSION: Fractures of the distal phalanges of the first, third, and fourth digits as above. Electronically Signed   By: Elgie Collard M.D.   On: 09/20/2020 21:01    Micro Results     Recent Results (from the past 240 hour(s))  Resp Panel by RT-PCR (Flu A&B, Covid) Nasopharyngeal Swab     Status: None   Collection Time: 09/20/20  8:06 PM   Specimen: Nasopharyngeal Swab; Nasopharyngeal(NP) swabs in vial transport medium  Result Value Ref Range Status   SARS Coronavirus 2 by RT PCR NEGATIVE NEGATIVE Final    Comment: (NOTE) SARS-CoV-2 target nucleic acids are NOT DETECTED.  The SARS-CoV-2 RNA is generally detectable in upper respiratory specimens during the acute phase of infection. The lowest concentration of SARS-CoV-2 viral copies this assay can detect is 138 copies/mL. A negative result does not preclude SARS-Cov-2 infection and should not be used as the sole basis for treatment or other patient management decisions. A negative result may occur with  improper specimen collection/handling, submission of specimen other than nasopharyngeal swab, presence of viral mutation(s) within the areas  targeted by this assay, and inadequate number of viral copies(<138 copies/mL). A negative result must be combined with clinical observations, patient history, and epidemiological information. The expected result is Negative.  Fact Sheet for  Patients:  BloggerCourse.com  Fact Sheet for Healthcare Providers:  SeriousBroker.it  This test is no t yet approved or cleared by the [place] FDA and  has been authorized for detection and/or diagnosis of SARS-CoV-2 by FDA under an Emergency Use Authorization (EUA). This EUA will remain  in effect (meaning this test can be used) for the duration of the COVID-19 declaration under Section 564(b)(1) of the Act, 21 U.S.C.section 360bbb-3(b)(1), unless the authorization is terminated  or revoked sooner.       Influenza A by PCR NEGATIVE NEGATIVE Final   Influenza B by PCR NEGATIVE NEGATIVE Final    Comment: (NOTE) The Xpert Xpress SARS-CoV-2/FLU/RSV plus assay is intended as an aid in the diagnosis of influenza from Nasopharyngeal swab specimens and should not be used as a sole basis for treatment. Nasal washings and aspirates are unacceptable for Xpert Xpress SARS-CoV-2/FLU/RSV testing.  Fact Sheet for Patients: BloggerCourse.com  Fact Sheet for Healthcare Providers: SeriousBroker.it  This test is not yet approved or cleared by the [place] FDA and has been authorized for detection and/or diagnosis of SARS-CoV-2 by FDA under an Emergency Use Authorization (EUA). This EUA will remain in effect (meaning this test can be used) for the duration of the COVID-19 declaration under Section 564(b)(1) of the Act, 21 U.S.C. section 360bbb-3(b)(1), unless the authorization is terminated or revoked.  Performed at [facility], [address], [place], [place] [address]     Today   Subjective    Julie Huffman today has no headache,no chest abdominal pain,no new weakness tingling or numbness, feels much better wants to go home today.    Objective   Blood pressure 128/63, pulse 80, temperature 97.7 F (36.5 C), temperature source Oral, resp. rate 18, height 5\' 1"   (1.549 m), weight 84.4 kg, SpO2 90 %.   Intake/Output Summary (Last 24 hours) at 09/22/2020 0823 Last data filed at 09/21/2020 1600 Gross per 24 hour  Intake 1205.36 ml  Output --  Net 1205.36 ml    Exam  Awake Alert, No new F.N deficits, Normal affect [place].AT,PERRAL Supple Neck,No JVD, No cervical lymphadenopathy appriciated.  Symmetrical Chest wall movement, Good air movement bilaterally, CTAB RRR,No Gallops,Rubs or new Murmurs, No Parasternal Heave +ve B.Sounds, Abd Soft, Non tender, No organomegaly appriciated, No rebound -guarding or rigidity. No Cyanosis, L. Hand in bandage    Data Review   CBC w Diff:  Lab Results  Component Value Date   WBC 11.2 (H) 09/22/2020   HGB 12.1 09/22/2020   HCT 37.9 09/22/2020   PLT 214 09/22/2020   LYMPHOPCT 6 09/22/2020   MONOPCT 2 09/22/2020   EOSPCT 0 09/22/2020   BASOPCT 0 09/22/2020    CMP:  Lab Results  Component Value Date   NA 139 09/22/2020   K 4.4 09/22/2020   CL 101 09/22/2020   CO2 29 09/22/2020   BUN 11 09/22/2020   CREATININE 0.78 09/22/2020   PROT 5.7 (L) 09/22/2020   ALBUMIN 3.0 (L) 09/22/2020   BILITOT 0.6 09/22/2020   ALKPHOS 64 09/22/2020   AST 48 (H) 09/22/2020   ALT 22 09/22/2020  .   Total Time in preparing paper work, data evaluation and todays exam - 35 minutes  09/24/2020 M.D on 09/22/2020 at 8:23 AM  [facility]

## 2020-09-22 NOTE — Progress Notes (Signed)
Pt states pain has been alleviated by meds given. Pt is in bed resting with bipap in place.

## 2020-09-22 NOTE — Plan of Care (Signed)
Pain management: Pt will continue to be pain free. Will give pain medication as indicated and reevaluate effectiveness.

## 2020-09-26 LAB — CULTURE, BLOOD (ROUTINE X 2)
Culture: NO GROWTH
Culture: NO GROWTH
Special Requests: ADEQUATE
Special Requests: ADEQUATE

## 2022-01-03 IMAGING — CT CT CHEST W/ CM
2 of 3 series · 15 of 36 positions shown, 18 images · IV contrast (Omni 300)
Comparison: Portable chest 4646 hours today.

CLINICAL DATA: 54-year-old female with shortness of breath.
Abnormal left perihilar density on x-ray.

EXAM:
CT CHEST WITH CONTRAST
TECHNIQUE: Multidetector CT imaging of the chest was performed during
intravenous contrast administration.
CONTRAST:  80mL OMNIPAQUE IOHEXOL 300 MG/ML  SOLN

[Series 3: chest with 2mm st · axial · 0.98mm/px · z∈[+1182,+1458]mm · 12 of 162 slices shown, 15 images]
[im 12/162  mediastinal]
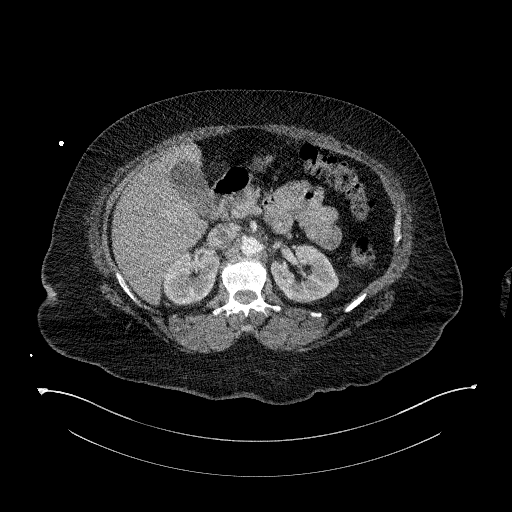
[im 12/162  lung]
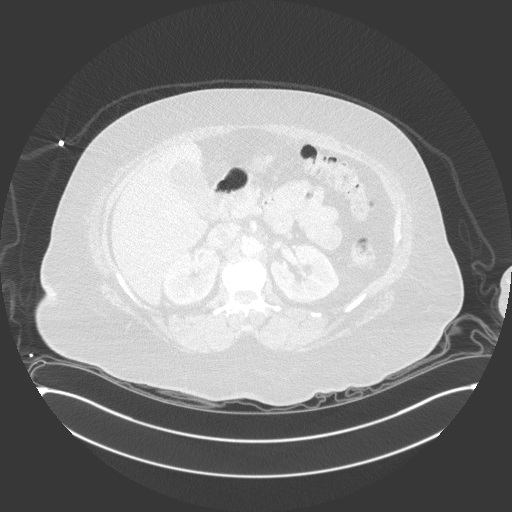
[im 24/162  lung]
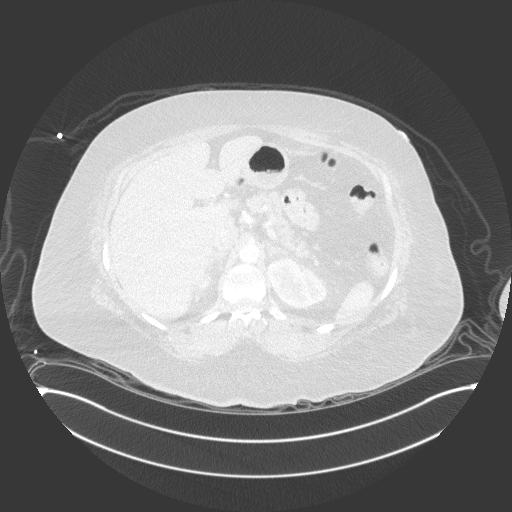
[im 36/162  lung]
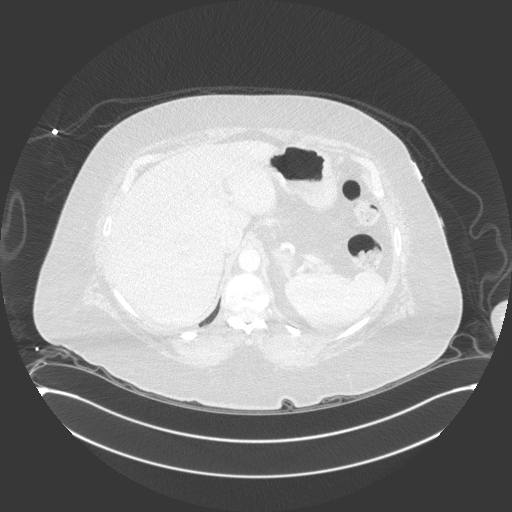
[im 48/162  lung]
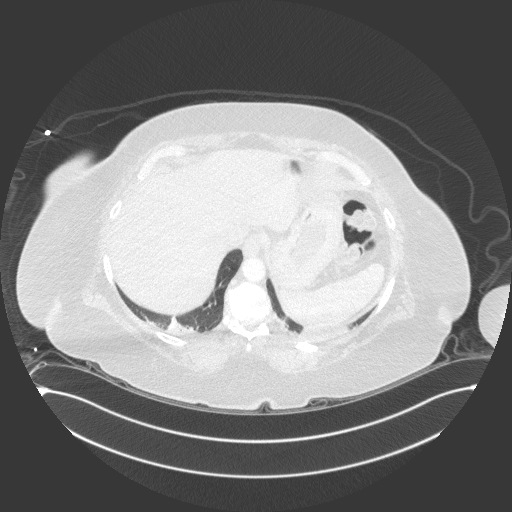
[im 60/162  mediastinal]
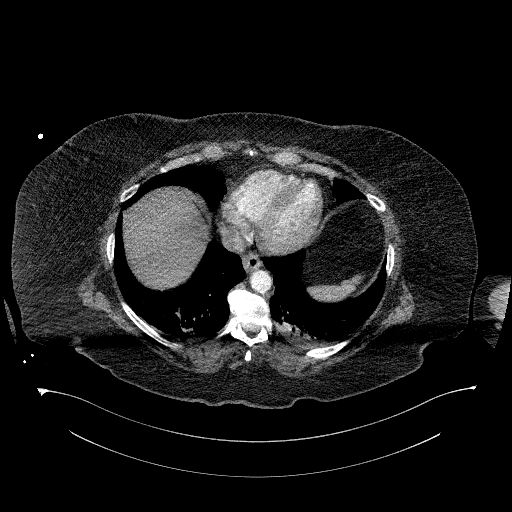
[im 60/162  lung]
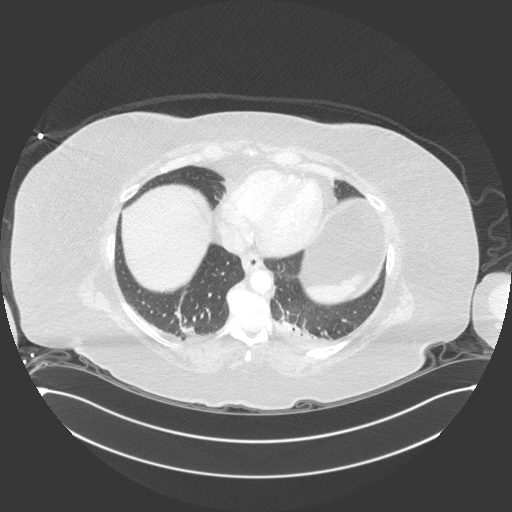
[im 72/162  lung]
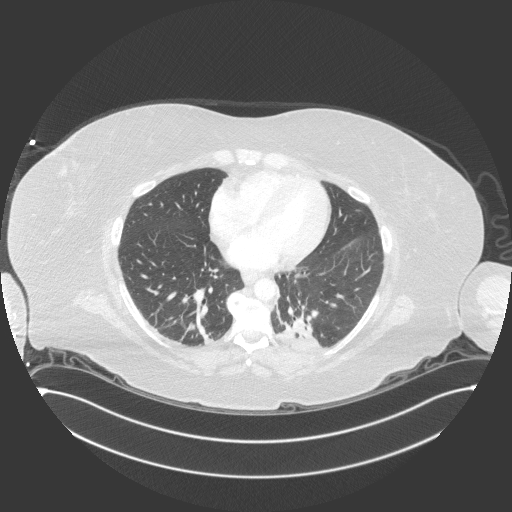
[im 90/162  lung]
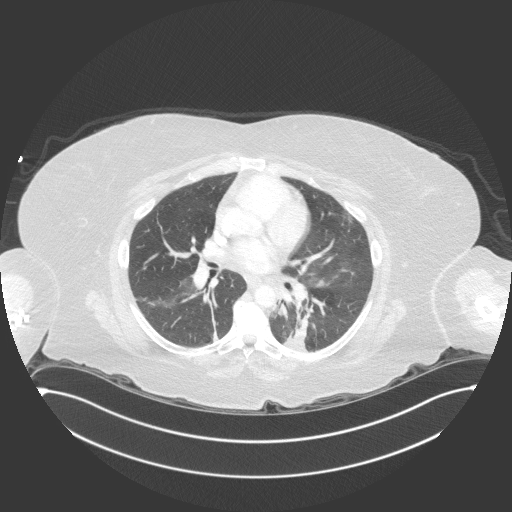
[im 102/162  lung]
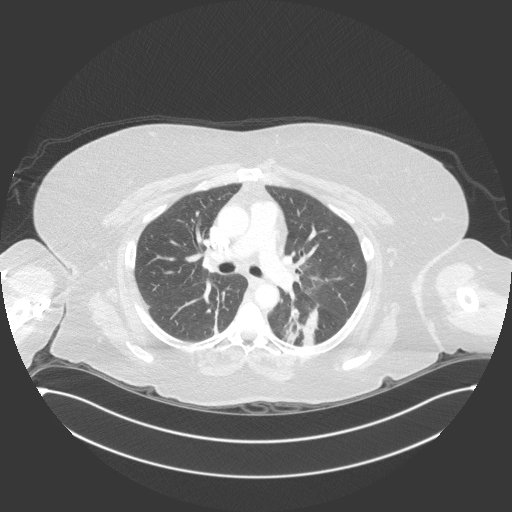
[im 114/162  mediastinal]
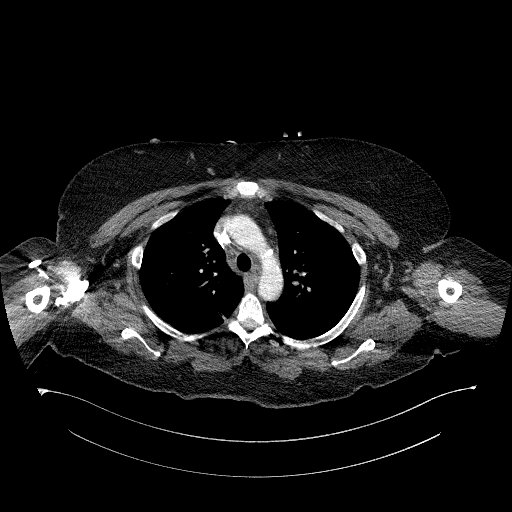
[im 114/162  lung]
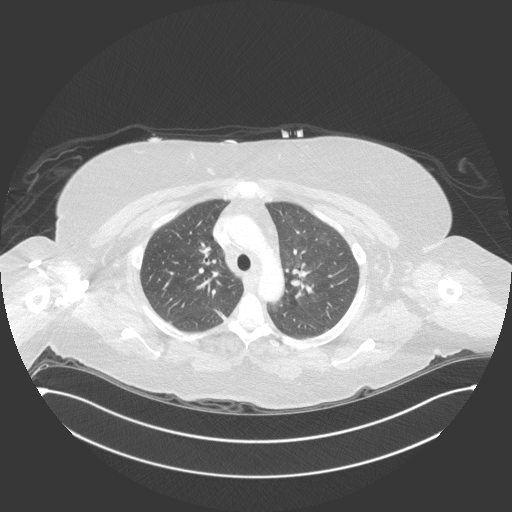
[im 126/162  lung]
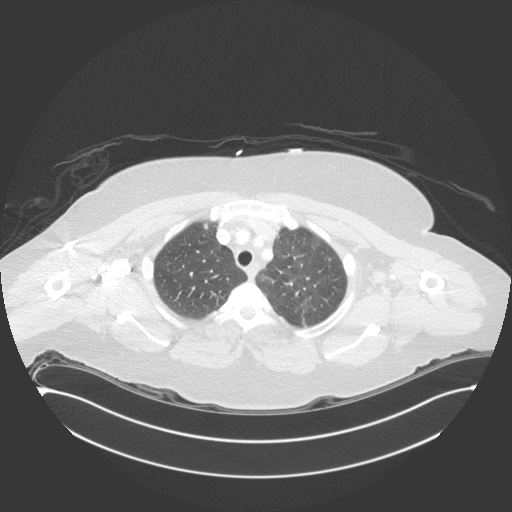
[im 138/162  lung]
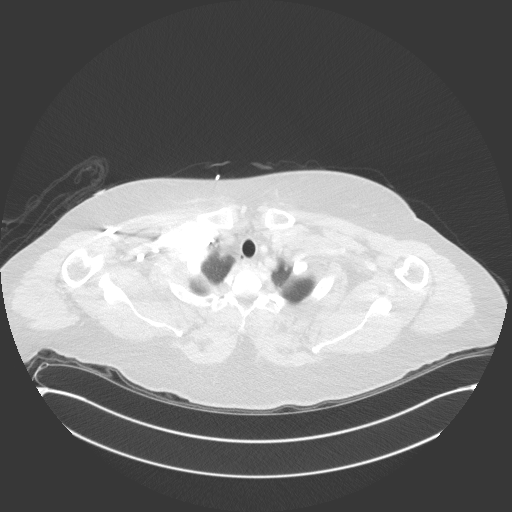
[im 150/162  lung]
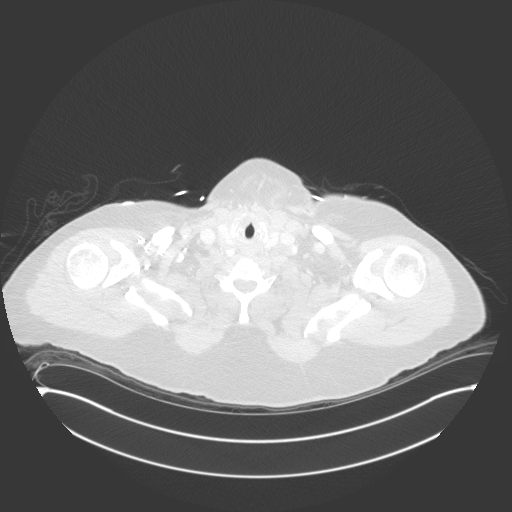

[Series 5: chest with 2mm st cor · coronal · 0.63mm/px · 3 of 151 slices shown]
[im 31/151  lung]
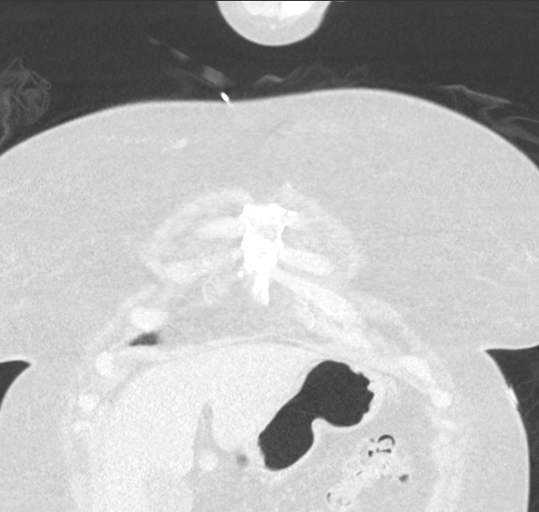
[im 61/151  lung]
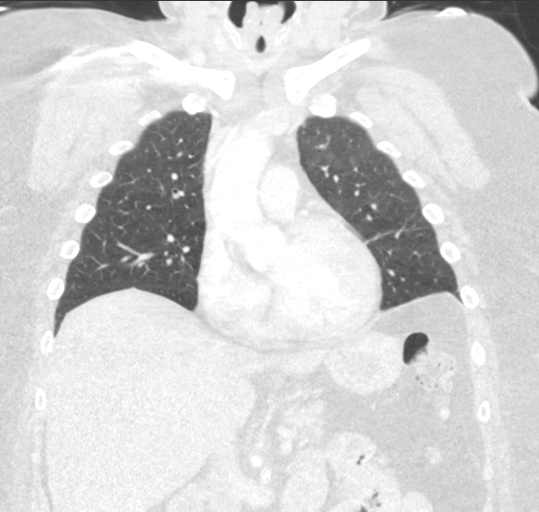
[im 91/151  lung]
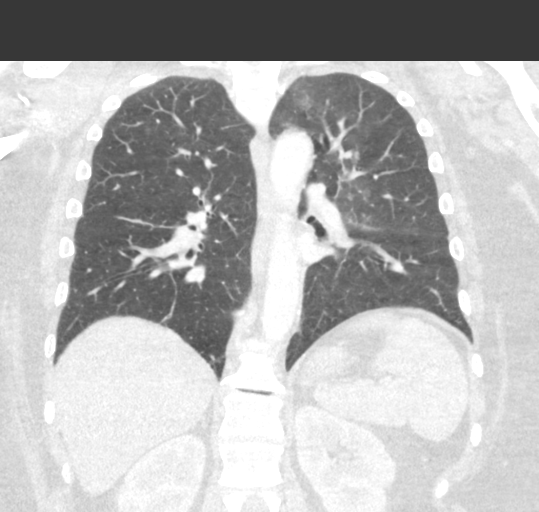

[15 of 36 positions shown; findings below may reference images not displayed]

FINDINGS: Cardiovascular: No cardiomegaly or pericardial effusion. Negative
thoracic aorta, minimal atherosclerosis. Main pulmonary arteries
appear patent. No calcified coronary artery atherosclerosis is
evident.

Mediastinum/Nodes: Negative.  No lymphadenopathy.

Lungs/Pleura: Major airways are patent. There is segmental
consolidation in the left lower lobe superior and posterior basal
segments with air bronchograms. Similar but lesser streaky opacity
in the right lower lobe. Additional nonspecific and subtle left
upper lobe ground-glass and streaky peribronchial opacity (series 4,
images 32 and 44). Right upper lobe, right middle lobe and lingula
appear spared. No pleural effusion.

Upper Abdomen: Negative visible liver, gallbladder, spleen,
pancreas, adrenal glands, and bowel in the upper abdomen. Negative
visible left kidney. Mild right renal midpole scarring suspected.

Musculoskeletal: Thoracic disc degeneration. Lower thoracic vacuum
disc. No acute or suspicious osseous lesion identified.

Other findings: Ptosis of the left submandibular gland suspected and
visible at the left thoracic inlet on series 3 image 9.
Contralateral right submandibular gland suspected on image 3. No
definite thoracic inlet lymphadenopathy. No axillary
lymphadenopathy.
IMPRESSION: 1. Segmental consolidation in the left lower lobe most compatible
with pneumonia. Additional nonspecific left upper lobe ground-glass
and streaky peribronchial opacity may be early infectious
involvement. No pleural effusion.
2. Mild pulmonary atelectasis, scarring otherwise.
3. Mediastinum and upper abdomen appear negative.

## 2022-11-06 ENCOUNTER — Other Ambulatory Visit: Payer: Self-pay | Admitting: Anesthesiology

## 2022-11-06 DIAGNOSIS — M5416 Radiculopathy, lumbar region: Secondary | ICD-10-CM

## 2022-11-06 DIAGNOSIS — M79674 Pain in right toe(s): Secondary | ICD-10-CM

## 2022-11-20 ENCOUNTER — Ambulatory Visit: Payer: BC Managed Care – PPO | Admitting: Podiatry

## 2022-11-20 DIAGNOSIS — M2041 Other hammer toe(s) (acquired), right foot: Secondary | ICD-10-CM

## 2022-12-11 ENCOUNTER — Ambulatory Visit: Payer: BC Managed Care – PPO | Admitting: Podiatry

## 2022-12-16 ENCOUNTER — Other Ambulatory Visit: Payer: BC Managed Care – PPO

## 2023-03-13 ENCOUNTER — Ambulatory Visit
Admission: RE | Admit: 2023-03-13 | Discharge: 2023-03-13 | Disposition: A | Discharge status: Home / Self Care | Payer: BC Managed Care – PPO | Source: Ambulatory Visit | Attending: Anesthesiology | Admitting: Anesthesiology

## 2023-03-13 DIAGNOSIS — M79674 Pain in right toe(s): Secondary | ICD-10-CM

## 2023-03-13 DIAGNOSIS — M5416 Radiculopathy, lumbar region: Secondary | ICD-10-CM
